# Patient Record
Sex: Male | Born: 1945 | Race: Black or African American | Hispanic: No | Marital: Single | Smoking: Current every day smoker
Health system: Southern US, Community
[De-identification: ages and names within clinical notes are randomized; demographics above are authoritative.]

## PROBLEM LIST (undated history)

## (undated) DIAGNOSIS — M545 Low back pain, unspecified: Secondary | ICD-10-CM

## (undated) DIAGNOSIS — K219 Gastro-esophageal reflux disease without esophagitis: Secondary | ICD-10-CM

## (undated) DIAGNOSIS — G8929 Other chronic pain: Secondary | ICD-10-CM

## (undated) DIAGNOSIS — L03116 Cellulitis of left lower limb: Secondary | ICD-10-CM

## (undated) DIAGNOSIS — M1990 Unspecified osteoarthritis, unspecified site: Secondary | ICD-10-CM

## (undated) HISTORY — PX: BACK SURGERY: SHX140

## (undated) HISTORY — PX: POSTERIOR FUSION LUMBAR SPINE: SUR632

---

## 1978-10-11 HISTORY — PX: LACERATION REPAIR: SHX5168

## 2004-04-27 ENCOUNTER — Emergency Department (HOSPITAL_COMMUNITY): Admission: EM | Admit: 2004-04-27 | Discharge: 2004-04-27 | Payer: Self-pay | Admitting: Emergency Medicine

## 2004-05-24 ENCOUNTER — Emergency Department (HOSPITAL_COMMUNITY): Admission: EM | Admit: 2004-05-24 | Discharge: 2004-05-25 | Payer: Self-pay | Admitting: *Deleted

## 2004-06-05 ENCOUNTER — Ambulatory Visit (HOSPITAL_COMMUNITY): Admission: RE | Admit: 2004-06-05 | Discharge: 2004-06-05 | Payer: Self-pay | Admitting: Family Medicine

## 2004-07-22 ENCOUNTER — Encounter: Admission: RE | Admit: 2004-07-22 | Discharge: 2004-07-22 | Payer: Self-pay | Admitting: Neurosurgery

## 2004-09-23 ENCOUNTER — Inpatient Hospital Stay (HOSPITAL_COMMUNITY): Admission: RE | Admit: 2004-09-23 | Discharge: 2004-09-27 | Payer: Self-pay | Admitting: Neurosurgery

## 2004-10-23 ENCOUNTER — Encounter: Admission: RE | Admit: 2004-10-23 | Discharge: 2004-10-23 | Payer: Self-pay | Admitting: Neurosurgery

## 2004-12-18 ENCOUNTER — Encounter: Admission: RE | Admit: 2004-12-18 | Discharge: 2004-12-18 | Payer: Self-pay | Admitting: Neurosurgery

## 2005-03-26 ENCOUNTER — Encounter: Admission: RE | Admit: 2005-03-26 | Discharge: 2005-03-26 | Payer: Self-pay | Admitting: Neurosurgery

## 2005-10-24 ENCOUNTER — Emergency Department (HOSPITAL_COMMUNITY): Admission: EM | Admit: 2005-10-24 | Discharge: 2005-10-24 | Payer: Self-pay | Admitting: Emergency Medicine

## 2005-12-08 ENCOUNTER — Emergency Department (HOSPITAL_COMMUNITY): Admission: EM | Admit: 2005-12-08 | Discharge: 2005-12-08 | Payer: Self-pay | Admitting: Emergency Medicine

## 2006-03-21 ENCOUNTER — Emergency Department (HOSPITAL_COMMUNITY): Admission: EM | Admit: 2006-03-21 | Discharge: 2006-03-21 | Payer: Self-pay | Admitting: Emergency Medicine

## 2006-08-31 ENCOUNTER — Emergency Department (HOSPITAL_COMMUNITY): Admission: EM | Admit: 2006-08-31 | Discharge: 2006-08-31 | Payer: Self-pay | Admitting: Emergency Medicine

## 2006-09-01 ENCOUNTER — Emergency Department (HOSPITAL_COMMUNITY): Admission: EM | Admit: 2006-09-01 | Discharge: 2006-09-01 | Payer: Self-pay | Admitting: Emergency Medicine

## 2006-09-07 ENCOUNTER — Emergency Department (HOSPITAL_COMMUNITY): Admission: EM | Admit: 2006-09-07 | Discharge: 2006-09-07 | Payer: Self-pay | Admitting: *Deleted

## 2006-09-14 ENCOUNTER — Emergency Department (HOSPITAL_COMMUNITY): Admission: EM | Admit: 2006-09-14 | Discharge: 2006-09-14 | Payer: Self-pay | Admitting: *Deleted

## 2007-01-18 ENCOUNTER — Encounter: Admission: RE | Admit: 2007-01-18 | Discharge: 2007-01-18 | Payer: Self-pay | Admitting: Neurosurgery

## 2007-07-10 ENCOUNTER — Emergency Department (HOSPITAL_COMMUNITY): Admission: EM | Admit: 2007-07-10 | Discharge: 2007-07-10 | Payer: Self-pay | Admitting: Emergency Medicine

## 2007-10-14 ENCOUNTER — Encounter: Admission: RE | Admit: 2007-10-14 | Discharge: 2007-10-14 | Payer: Self-pay | Admitting: Neurosurgery

## 2008-04-10 ENCOUNTER — Inpatient Hospital Stay (HOSPITAL_COMMUNITY): Admission: RE | Admit: 2008-04-10 | Discharge: 2008-04-13 | Payer: Self-pay | Admitting: Neurosurgery

## 2008-05-08 ENCOUNTER — Encounter: Admission: RE | Admit: 2008-05-08 | Discharge: 2008-05-08 | Payer: Self-pay | Admitting: Neurosurgery

## 2009-06-19 ENCOUNTER — Encounter: Admission: RE | Admit: 2009-06-19 | Discharge: 2009-06-19 | Payer: Self-pay | Admitting: Neurosurgery

## 2009-08-09 HISTORY — PX: SYNOVIAL CYST EXCISION: SUR507

## 2009-08-09 HISTORY — PX: LUMBAR LAMINECTOMY: SHX95

## 2009-08-16 ENCOUNTER — Ambulatory Visit (HOSPITAL_COMMUNITY): Admission: RE | Admit: 2009-08-16 | Discharge: 2009-08-17 | Payer: Self-pay | Admitting: Neurosurgery

## 2009-12-09 ENCOUNTER — Emergency Department (HOSPITAL_COMMUNITY): Admission: EM | Admit: 2009-12-09 | Discharge: 2009-12-09 | Payer: Self-pay | Admitting: Emergency Medicine

## 2010-03-02 ENCOUNTER — Encounter: Payer: Self-pay | Admitting: Neurosurgery

## 2010-04-27 LAB — SURGICAL PCR SCREEN
MRSA, PCR: NEGATIVE
Staphylococcus aureus: NEGATIVE

## 2010-04-27 LAB — CBC
MCV: 91 fL (ref 78.0–100.0)
Platelets: 217 10*3/uL (ref 150–400)
RBC: 4.8 MIL/uL (ref 4.22–5.81)
RDW: 13.1 % (ref 11.5–15.5)
WBC: 4.4 10*3/uL (ref 4.0–10.5)

## 2010-04-27 LAB — DIFFERENTIAL
Basophils Absolute: 0 10*3/uL (ref 0.0–0.1)
Lymphocytes Relative: 34 % (ref 12–46)
Lymphs Abs: 1.5 10*3/uL (ref 0.7–4.0)
Neutro Abs: 2.3 10*3/uL (ref 1.7–7.7)
Neutrophils Relative %: 51 % (ref 43–77)

## 2010-04-27 LAB — BASIC METABOLIC PANEL
BUN: 9 mg/dL (ref 6–23)
Calcium: 9.5 mg/dL (ref 8.4–10.5)
Chloride: 104 mEq/L (ref 96–112)
Creatinine, Ser: 0.92 mg/dL (ref 0.4–1.5)
GFR calc Af Amer: >60 mL/min (ref >60)
GFR calc non Af Amer: >60 mL/min (ref >60)

## 2010-04-27 LAB — TYPE AND SCREEN
ABO/RH(D): A POS
Antibody Screen: NEGATIVE

## 2010-05-27 LAB — BASIC METABOLIC PANEL
BUN: 7 mg/dL (ref 6–23)
Creatinine, Ser: 0.93 mg/dL (ref 0.4–1.5)
GFR calc non Af Amer: >60 mL/min (ref >60)
Glucose, Bld: 103 mg/dL — ABNORMAL HIGH (ref 70–99)

## 2010-05-27 LAB — CBC
HCT: 43.7 % (ref 39.0–52.0)
MCV: 90.2 fL (ref 78.0–100.0)
Platelets: 241 10*3/uL (ref 150–400)
RDW: 13.3 % (ref 11.5–15.5)
WBC: 3.7 10*3/uL — ABNORMAL LOW (ref 4.0–10.5)

## 2010-05-27 LAB — TYPE AND SCREEN: ABO/RH(D): A POS

## 2010-05-27 LAB — ABO/RH: ABO/RH(D): A POS

## 2010-06-24 NOTE — Op Note (Signed)
[name], [name]               ACCOUNT NO.:  [id number]   MEDICAL RECORD NO.:  [id number]          PATIENT TYPE:  INP   LOCATION:  3001                         FACILITY:  [facility]   PHYSICIAN:  Kathaleen Maser. Pool, M.D.    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  Status post L4-5 posterior lumbar interbody fusion  utilizing tangent interbody allograft wedge, Telamon interbody PEEK cage  and local autografting and posterolateral fusion with pedicle screw  instrumentation.   POSTOPERATIVE DIAGNOSES:  L3-4 degenerative disk disease with  spondylosis and stenosis.  Status post L4-5 posterior lumbar interbody  fusion utilizing tangent interbody allograft wedge, Telamon interbody  PEEK cage and local autografting and posterolateral fusion with pedicle  screw instrumentation.   PROCEDURE NAME:  Reexploration of L4-5 fusion with removal of hardware.  L3-4 redo decompressive laminectomy with bilateral L3 and L4  decompressive foraminotomies, more than it would be required for simple  interbody fusion alone.  L3-4 posterior lumbar interbody fusion  utilizing tangent interbody allograft wedge, Telamon interbody PEEK cage  and local autografting.  L3-4 posterolateral arthrodesis utilizing non-  segmental pedicle screw instrumentation and local autograft.   SURGEON:  Kathaleen Maser. Pool, MD   ASSISTANT:  Reinaldo Meeker, MD   ANESTHESIA:  General endotracheal.   INDICATIONS:  Mr. [name] is a 65 year old male status post previous L4-  5 decompression and fusion.  The patient presents now with worsening  back and lower extremity pain.  Workup demonstrates evidence of  significant stenosis at the L3-4 level above the level of previous  fusion.  Fusion at L4-5 appears to be solid.  The patient presents now  for L3-4  decompression and fusion and instrumentation.   OPERATIVE NOTE:  The patient was brought to the operative room and  placed on the operating table in the supine position.  After adequate  level of anesthesia was achieved, the patient was positioned prone onto  a Wilson frame, appropriately padded, the patient's lumbar region was  prepped and sterilely.  A #10 blade was used to make a curvilinear skin  incision overlying the L3, L4, and L5 levels.  This was carried sharply  in the midline.  Subperiosteal dissection was then performed exposing  the lamina and facet joints of L3 and L4 as well as the transverse  processes of L3 and L4.  Deep self-retaining retractor was placed.  Intraoperative fluoroscopy was used and levels were confirmed.  Previous  pedicle screw instrumentation at L4-5 was dissected free.  Pedicle screw  instrumentation was disassembled.  Fusion at L4-5 was then inspected and  found to be solid.  Pedicle screws at L5 were removed.  Pedicle screws  at L4 were left in place.  Previous laminectomy defect was cleaned of  epidural scar.  Complete laminectomy of L3 was then performed.  Complete  inferior facetectomies of L3 were performed bilaterally.  Complete  inferior facetectomies of L3 were performed bilaterally.  Superior  facetectomies of L4 were performed bilaterally.  Ligament flavum and  epidural scar were elevated and resected in piecemeal fashion using  Kerrison rongeurs.  The underlying thecal sac and exiting L3 and L4  nerve root were identified.  Complete decompressive foraminotomies were  then performed along its course of exiting nerve roots bilaterally.  Bilateral diskectomies were then performed at L3-4.  Disk space was then  sequentially dilated to 10 mm.  With a 10-mm distractor left on the  patient's right side.  Thecal sac and nerve root were inspected on the  left side.  Disk space was then reamed and cut with 10-mm tangent  instrument.  Soft tissue was  removed from the interspace.  A 10 x 26 mm  Telamon cage was packed with morselized autograft, then packed into  place, recessed approximately 2 mm from posterior cortical margin of L3.  Distractor was removed from the patient's right side.  Thecal sac and  nerve root were inspected on the right side.  Disk space was again  reamed and then cut with a 10-mm tangent instrument.  Soft tissues were  then removed from the interspace.  Disk space was further curettaged.  Morselized autograft was then packed into the interspace.  An 10 x 26 mm  tangent wedge was then packed into place and recessed approximately 2 mm  from posterior cortical margin of L3.  Pedicles of L3 were then  identified using surface landmarks and intraoperative fluoroscopy.  Superficial bone overlying the pedicle was then removed using a high-  speed drill.  Each pedicle was then probed using pedicle awl.  Pedicle  awl track was then tapped with 5.2 mm screw tapper.  Each screw tap hole  was then probed and found to be solid within bone.  A 6.75 x 45 mm  radius screws were placed bilaterally at L3.  Transverse processes of L3  and L4 were then decorticated using high speed drill.  Morselized  autograft was packed posterolaterally for later fusion.  A short segment  of titanium rod was then placed over the screw heads at L3 and L4.  Locking caps were placed over screw head.  Locking caps were then  engaged in the construct under compression.  Final images revealed good  position of bone grafts, hardware at operative level, normal alignment  of spine.  Wound was then irrigated with antibiotic solution.  Gelfoam  was placed topically and hemostasis was found to be good.  A medium  Hemovac drain was left in the interspace.  Wound was then closed in  layers with Vicryl suture.  Steri-Strips and sterile dressing were  applied.  There were no complications.  The patient tolerated the  procedure well and returned to the recovery  room postoperatively.           ______________________________  Kathaleen Maser Pool, M.D.     HAP/MEDQ  D:  04/10/2008  T:  04/11/2008  Job:  [id number]

## 2010-06-24 NOTE — Discharge Summary (Signed)
NAMETERION, HEDMAN               ACCOUNT NO.:  [id number]   MEDICAL RECORD NO.:  [id number]          PATIENT TYPE:  INP   LOCATION:  3001                         FACILITY:  [facility]   PHYSICIAN:  Kathaleen Maser. Pool, M.D.    DATE OF BIRTH:  Mar 04, 1945   DATE OF ADMISSION:  04/10/2008  DATE OF DISCHARGE:  04/12/2008                               DISCHARGE SUMMARY   FINAL DIAGNOSIS:  L3-4 degenerative disk disease with stenosis and  instability.   OPERATION AND TREATMENTS:  L3-4 decompressive laminectomy with L3-4  posterior interbody lumbar fusion with the tangent interbody allograft  wedge, Telamon interbody PEEK cage, and local autografting.   HISTORY OF PRESENT ILLNESS:  [name] is a 65 year old male who is  status post previous L4-5 decompression and fusion, now presents with L3-  4 stenosis and instability.  The patient presents now for decompression  and fusion.   HOSPITAL COURSE:  The patient was taken to the operating room and  underwent uncomplicated L3-4 decompression and fusion with  instrumentation was performed.  Postoperatively, the patient has done  quite well.  His preoperative back and right lower extremity pain has  resolved.  His strength and sensation are intact.  His wound is healing  well.  He is gradually mobilized with the aid of physical and  occupational therapy without difficulty.  He is up ambulatory without  assistance.  He is tolerating a regular diet.  Plan is for him to be  discharged to a short-term nursing facility for further rehabilitation  as the patient lives alone.   CONDITION ON DISCHARGE:  Improved.   DISCHARGE FOLLOWUP:  The patient is to follow up in my office in 2  weeks.   DISCHARGE WOUND CARE:  The patient is to keep his wound covered,  changing the bandage after showering.   DISCHARGE MEDICATIONS:  Percocet 1-2 p.o. p.r.n. q.4 h. as needed for  pain.  Valium 5 mg to 10 mg p.o. q.6 h. p.r.n. as needed for spasm and  pain.  The  patient is no other medications.           ______________________________  Kathaleen Maser Pool, M.D.     HAP/MEDQ  D:  04/12/2008  T:  04/13/2008  Job:  [id number]

## 2010-06-27 NOTE — Op Note (Signed)
[name], [name] NO.:  [id number]   MEDICAL RECORD NO.:  [id number]          PATIENT TYPE:  INP   LOCATION:  2899                         FACILITY:  [facility]   PHYSICIAN:  Kathaleen Maser. Pool, M.D.    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   OPERATION PERFORMED:  1.  L4-5 decompressive laminectomy with foraminotomy.  Decompression much      more extensive that required for simple interbody fusion alone.  2.  Posterior lumbar interbody fusion utilizing tangent interbody allograft      wedge, Telemon interbody cage and local autografting.  Posterolateral      arthrodesis utilizing nonsegmental pedicle screw instrumentation and      local autografting.   SURGEON:  Kathaleen Maser. Pool, M.D.   ASSISTANT:  Reinaldo Meeker, M.D.   ANESTHESIA:  General endotracheal.   INDICATIONS FOR PROCEDURE:  [name] is a 65 year old male with history  of back and bilateral lower extremity pain, paresthesias and weakness.  Work-  up demonstrates evidence of a grade 1 L4-5 degenerative spondylolisthesis  with severe stenosis.  The patient has been counseled as to his options. He  has decided to proceed with an L4-5 decompression and fusion with  instrumentation.   DESCRIPTION OF PROCEDURE:  The patient was taken to the operating room and  placed on the table in the supine position.  After adequate level of  anesthesia was achieved, the patient was positioned prone onto a Wilson  frame and appropriately padded.  The patient's lumbar region was prepped and  draped sterilely.  A 10 blade was used to make a linear skin incision  overlying the L3, 4, and 5 levels.  This was carried down sharply in the  midline.  A  subperiosteal dissection was then performed exposing the lamina  and facet joints of the L3, L4 and L5 levels as well as the L4 and L5  transverse processes.  Deep self-retaining retractor was placed.  Intraoperative fluoroscopy was used, the levels were confirmed.  Decompressive laminectomy was then performed using Leksell rongeurs,  Kerrison rongeurs and high speed drill to remove the entire lamina of L4 and  the superior aspect and lamina of L5.  Complete inferior facetectomy was  performed bilaterally at L4.  Superior facetectomy was then performed  bilaterally at L5.  All bone was cleaned and used in later autografting.  The  ligamentum flavum was then elevated and resected in piecemeal fashion using  Kerrison rongeurs.  The underlying thecal sac and exiting L4 and L5 nerve  roots were identified.  Wide foraminotomies were then performed along the  course of the exiting nerve roots bilaterally.  Epidural venous plexus was  coagulated and cut.  Starting first on patient's left side, the thecal sac  and nerve roots were mobilized and retracted towards the midline.  The disk  space was isolated and incised with a 15 blade in rectangular fashion.  A  wide disk space cleanout was then achieved using pituitary rongeurs and  forward angled Epstein curets and upward biting pituitary rongeurs.  The  procedure was then repeated on the contralateral side.  After a very  thorough diskectomy had been performed, the patient's disk space was then  distracted up to 12 mm.  With a 12 mm distractor left in the patient's right  side, the thecal sac and nerve roots were protected on the left side.  The  disk space was then reamed with 12 mm tangent box cutter and then cut with  12 mm tangent chisel.  Soft tissues were removed from the interspace.  A 12  x 26 mm Telemon cage packed with interbody bone and demineralized bone  matrix was then impacted into place and recessed approximately 2 mm from the   posterior cortical margin.  Distractors removed from patient's right side.  Thecal sac and nerve root protected on the right side.  Disk space once  again reamed and cut with 12 mm tangent instruments.  Soft tissues were  removed from the interspace.  The disk space was further curettaged.  Morcellized autograft was packed into the interspace.  A 12 x 26 mm tangent  wedge was then impacted into place, recessed approximately 2 mm from the  posterior cortical margin.  The pedicles at L4 and L5 were then identified  using surface landmarks and intraoperative fluoroscopy.  Superficial bone  overlying the pedicles was then removed utilizing high speed drill.  Each  pedicle was then probed using pedicle awl.  Each pedicle awl tract was then  tapped with 5.25 mm screw tap.  Each screw tap hole was probed and found to  be solidly within bone.  6.75 x 50 mm spiral 90D screws placed bilaterally  at L4, 6.75 x 45 mm screws placed bilaterally at L5.  Transverse process  were then decorticated using high speed drill at L4-5 bilaterally.  Morcellized autograft packed posterolaterally for later arthrodesis.  Short  segment titanium rod was then contoured and placed over the screw heads at  L4-5.  Locking caps were then placed over the screw heads.  Locking caps  then engaged with the construct under compression.  Final images revealed  good position of bone grafts and hardware at the proper operative levels  with normal alignment of the spine.  The wound was then irrigated with  antibiotic solution.  Gelfoam was placed topically for hemostasis which was  found to be good.  Microscope and retractor system were removed.  A medium  Hemovac drain was left in the epidural space.  The wound was then closed in  layers with Vicryl sutures.  Steri-Strips and sterile dressing were applied.  There were no apparent complications.  The patient tolerated the procedure well and returned to the recovery room  postoperatively.           ______________________________  Kathaleen Maser Pool, M.D.     HAP/MEDQ  D:  09/23/2004  T:  09/23/2004  Job:  [id number]

## 2010-06-27 NOTE — Discharge Summary (Signed)
NAMESIRRON, FRANCESCONI NO.:  [id number]   MEDICAL RECORD NO.:  [id number]           PATIENT TYPE:   LOCATION:                                 FACILITY:   PHYSICIAN:  Kathaleen Maser. Pool, M.D.         DATE OF BIRTH:   DATE OF ADMISSION:  09/23/2004  DATE OF DISCHARGE:  09/27/2004                                 DISCHARGE SUMMARY   SERVICE:  Neurosurgery.   FINAL DIAGNOSIS:  L4-5 degenerative grade I spondylolisthesis with stenosis.   HISTORY OF PRESENT ILLNESS:  Mr. Boomhower is a 65 year old male with a  history of symptomatic lumbar stenosis.  Workup demonstrates evidence of  marked stenosis secondary to disk degeneration and degenerative  spondylolisthesis with facet arthropathy.  The patient presents now for  decompression and fusion.   HOSPITAL COURSE:  The patient went to the operating room where an  uncomplicated L4-5 decompression and fusion was performed.  Postoperatively,  the patient's course was unremarkable.  He gradually mobilized.  The pain  was much improved.  He was able to be discharged on his third postoperative  day.   CONDITION ON DISCHARGE:  Improved.   DISCHARGE DISPOSITION:  The patient will be discharged to home and followup  in my office in one week.           ______________________________  Kathaleen Maser. Pool, M.D.     HAP/MEDQ  D:  08/06/2005  T:  08/06/2005  Job:  [id number]

## 2010-07-02 ENCOUNTER — Emergency Department (HOSPITAL_COMMUNITY): Payer: Medicare Other

## 2010-07-02 ENCOUNTER — Observation Stay (HOSPITAL_COMMUNITY)
Admission: EM | Admit: 2010-07-02 | Discharge: 2010-07-04 | Disposition: A | Discharge status: Home / Self Care | Payer: Medicare Other | Attending: Internal Medicine | Admitting: Internal Medicine

## 2010-07-02 DIAGNOSIS — Z8249 Family history of ischemic heart disease and other diseases of the circulatory system: Secondary | ICD-10-CM | POA: Insufficient documentation

## 2010-07-02 DIAGNOSIS — R0602 Shortness of breath: Secondary | ICD-10-CM | POA: Insufficient documentation

## 2010-07-02 DIAGNOSIS — R7309 Other abnormal glucose: Secondary | ICD-10-CM | POA: Insufficient documentation

## 2010-07-02 DIAGNOSIS — K047 Periapical abscess without sinus: Secondary | ICD-10-CM | POA: Insufficient documentation

## 2010-07-02 DIAGNOSIS — F17200 Nicotine dependence, unspecified, uncomplicated: Secondary | ICD-10-CM | POA: Insufficient documentation

## 2010-07-02 DIAGNOSIS — R0789 Other chest pain: Principal | ICD-10-CM | POA: Insufficient documentation

## 2010-07-02 DIAGNOSIS — Z79899 Other long term (current) drug therapy: Secondary | ICD-10-CM | POA: Insufficient documentation

## 2010-07-02 LAB — BASIC METABOLIC PANEL
BUN: 13 mg/dL (ref 6–23)
CO2: 29 mEq/L (ref 19–32)
Chloride: 99 mEq/L (ref 96–112)
Glucose, Bld: 101 mg/dL — ABNORMAL HIGH (ref 70–99)
Potassium: 4.3 mEq/L (ref 3.5–5.1)
Sodium: 139 mEq/L (ref 135–145)

## 2010-07-02 LAB — POCT CARDIAC MARKERS
CKMB, poc: 3.5 ng/mL (ref 1.0–8.0)
Troponin i, poc: <0.05 ng/mL (ref 0.00–0.09)

## 2010-07-02 LAB — CBC
HCT: 43.4 % (ref 39.0–52.0)
Hemoglobin: 14.6 g/dL (ref 13.0–17.0)
MCV: 90.4 fL (ref 78.0–100.0)
WBC: 6.6 10*3/uL (ref 4.0–10.5)

## 2010-07-02 LAB — CARDIAC PANEL(CRET KIN+CKTOT+MB+TROPI)
CK, MB: 3.6 ng/mL (ref 0.3–4.0)
CK, MB: 3.4 ng/mL (ref 0.3–4.0)
Relative Index: 1.3 (ref 0.0–2.5)
Total CK: 271 U/L — ABNORMAL HIGH (ref 7–232)
Total CK: 230 U/L (ref 7–232)
Troponin I: <0.3 ng/mL (ref <0.30)

## 2010-07-02 LAB — DIFFERENTIAL
Basophils Absolute: 0 10*3/uL (ref 0.0–0.1)
Eosinophils Relative: 2 % (ref 0–5)
Lymphocytes Relative: 21 % (ref 12–46)
Lymphs Abs: 1.4 10*3/uL (ref 0.7–4.0)
Neutro Abs: 4.2 10*3/uL (ref 1.7–7.7)

## 2010-07-03 DIAGNOSIS — I517 Cardiomegaly: Secondary | ICD-10-CM

## 2010-07-03 DIAGNOSIS — R079 Chest pain, unspecified: Secondary | ICD-10-CM

## 2010-07-03 DIAGNOSIS — R0602 Shortness of breath: Secondary | ICD-10-CM

## 2010-07-03 LAB — CBC
MCV: 89.3 fL (ref 78.0–100.0)
Platelets: 207 10*3/uL (ref 150–400)
RDW: 12.5 % (ref 11.5–15.5)
WBC: 4.8 10*3/uL (ref 4.0–10.5)

## 2010-07-03 LAB — CARDIAC PANEL(CRET KIN+CKTOT+MB+TROPI)
Relative Index: 1.5 (ref 0.0–2.5)
Troponin I: <0.3 ng/mL (ref <0.30)

## 2010-07-03 LAB — LIPID PANEL
HDL: 50 mg/dL (ref >39)
Total CHOL/HDL Ratio: 3.2 RATIO
Triglycerides: 92 mg/dL (ref <150)
VLDL: 18 mg/dL (ref 0–40)

## 2010-07-03 LAB — BASIC METABOLIC PANEL
BUN: 15 mg/dL (ref 6–23)
GFR calc Af Amer: >60 mL/min (ref >60)
GFR calc non Af Amer: >60 mL/min (ref >60)
Potassium: 3.9 mEq/L (ref 3.5–5.1)

## 2010-07-03 LAB — HEMOGLOBIN A1C: Mean Plasma Glucose: 140 mg/dL — ABNORMAL HIGH (ref <117)

## 2010-07-03 NOTE — Consult Note (Signed)
NAMEHARJAS, BIGGINS NO.:  [id number]  MEDICAL RECORD NO.:  [id number]           PATIENT TYPE:  O  LOCATION:  A311                          FACILITY:  [facility]  PHYSICIAN:  Jonelle Sidle, MD DATE OF BIRTH:  11/06/1945  DATE OF CONSULTATION: DATE OF DISCHARGE:                                CONSULTATION   REQUESTING SERVICE:  [facility] team.  PRIMARY CARE PHYSICIAN:  None.  REASON FOR CONSULTATION:  Chest pain and shortness of breath.  HISTORY OF PRESENT ILLNESS:  Mr. Barrack is a 65 year old male with no regular medical followup, on over-the-counter Aleve for chronic back pain but no other regular medications, and with history of lumbar surgery by Dr. Dutch Quint over the years, most recently in 2011.  He presented to the hospital complaining of chest tightness, some radiation to the left arm, on baseline history of progressive shortness of breath with exertion over the last several months.  He has also been experiencing some chest tightness with exertion, no unusual diaphoresis, nausea, or emesis.  He was also bothered by some mouth/tooth pain, with very poor dentition, and came in to be evaluated further.  He was admitted for observation and has ruled out for myocardial infarction with peak troponin-I level less than 0.30, normal CK-MB of relative indices.  His electrocardiogram shows sinus rhythm at 82 beats per minute with no acute ST-segment changes.  Echocardiography performed earlier today shows mild ventricular hypertrophy with a left ventricular ejection fraction of 60-65%, no regional wall motion abnormalities, mild aortic root dilatation, and trivial mitral regurgitation.  Chest x-ray demonstrates no acute findings.  We have been consulted to assist with further evaluation.  ALLERGIES:  No known drug allergies.  PAST MEDICAL HISTORY:  As noted above.  The patient denies any longstanding history of hypertension, type 2 diabetes  mellitus, hyperlipidemia, or cardiovascular disease.  He underwent L4-L5 decompression back in 2006, L3-L4 decompression in 2010, and L2-L3 laminectomy with resection of a synovial cyst in 2011.  MEDICATIONS: 1. Aspirin 325 mg p.o. daily. 2. Cleocin 300 mg p.o. q.i.d. 3. Lovenox 40 mg subcu q.24 h. 4. Lopressor 12.5 mg p.o. b.i.d. 5. Protonix 40 mg p.o. daily. 6. Zocor 10 mg p.o. daily.  FAMILY HISTORY:  Significant for cardiovascular disease, premature in his father and brother.  Otherwise noncontributory.  SOCIAL HISTORY:  The patient has a longstanding tobacco use history, approximately 50-pack-years, denies any illicit substance use or regular alcohol use.  REVIEW OF SYSTEMS:  As detailed above.  Denies any cough, fevers, chills, hemoptysis.  No reported melena or hematochezia.  Has had a lot of mouth pain and tooth pain with very poor dentition.  No palpitations or syncope.  Otherwise reviewed negative.  PHYSICAL EXAMINATION:  VITAL SIGNS:  Temperature is 98.4 degrees, heart rate in the 50s in sinus rhythm, respirations 18, blood pressure is 103/69, oxygen saturation is 97% on room air. GENERAL:  This is an overweight male, in no acute distress without active chest pain. HEENT:  Conjunctiva and lids normal.  Pharynx is clear with poor dentition. NECK:  Supple.  No elevated JVP or carotid  bruits.  No thyromegaly. LUNGS:  Clear with diminished breath sounds. CARDIAC:  Regular rate and rhythm.  No S3.  No pericardial rub.  No significant systolic murmur. ABDOMEN:  Soft and nontender.  Bowel sounds present. EXTREMITIES:  Exhibit no significant pitting edema.  Distal pulses are 1- 2+. SKIN:  Warm and dry. MUSCULOSKELETAL:  No kyphosis is noted. NEUROPSYCHIATRIC:  The patient is alert and oriented x3.  Affect is grossly appropriate.  LABORATORY DATA:  WBC 4.8, hemoglobin 12.8, hematocrit 38.5, platelets 207.  Sodium 134, potassium 3.9, chloride 98, bicarb 30, glucose  100, BUN 15, creatinine 0.8.  Hemoglobin A1c 6.5.  Peak CK 271 with normal CK- MB relative indices.  Normal troponin-I levels.  Total cholesterol 159, triglycerides 92, HDL 50, LDL 91.  D-dimer 0.50.  IMPRESSION: 1. Presentation with progressive dyspnea on exertion as well as chest     tightness, worse recently, associated with a nonspecific     electrocardiogram and normal cardiac markers.  Cardiac risk factors     include age, gender, family history, possible undiagnosed diabetes     mellitus based on elevated hemoglobin A1c.  Also longstanding     tobacco use history.  He is clinically stable at this time. Concern would be for     progressive angina and underlying CAD. 2. Lumbar disk disease as outlined above status post 3 prior surgical     interventions, most recently in 2011, by Dr. Dutch Quint. 3. Dental caries.  RECOMMENDATIONS:  We will schedule an exercise Myoview for tomorrow for ischemic evaluation.  Would hold Lopressor at that time.  Will follow up 12-lead electrocardiogram in the morning as well.  Our service will follow with you with further recommendations.     Jonelle Sidle, MD     SGM/MEDQ  D:  07/03/2010  T:  07/03/2010  Job:  [id number]  Electronically Signed by Nona Dell MD on 07/03/2010 05:57:51 PM

## 2010-07-04 ENCOUNTER — Encounter (HOSPITAL_COMMUNITY): Payer: Self-pay | Admitting: Radiology

## 2010-07-04 ENCOUNTER — Observation Stay (HOSPITAL_COMMUNITY): Payer: Medicare Other

## 2010-07-04 LAB — BASIC METABOLIC PANEL
Chloride: 103 mEq/L (ref 96–112)
Creatinine, Ser: 0.92 mg/dL (ref 0.4–1.5)
GFR calc Af Amer: >60 mL/min (ref >60)
Potassium: 4 mEq/L (ref 3.5–5.1)

## 2010-07-04 MED ORDER — TECHNETIUM TC 99M TETROFOSMIN IV KIT
10.0000 | PACK | Freq: Once | INTRAVENOUS | Status: AC | PRN
  Administered 2010-07-04: 10.33 via INTRAVENOUS

## 2010-07-04 MED ORDER — TECHNETIUM TC 99M TETROFOSMIN IV KIT
30.0000 | PACK | Freq: Once | INTRAVENOUS | Status: AC | PRN
  Administered 2010-07-04: 29.3 via INTRAVENOUS

## 2010-07-05 ENCOUNTER — Observation Stay (HOSPITAL_COMMUNITY): Payer: Medicare Other

## 2010-07-07 NOTE — H&P (Signed)
NAMEMARGIE, [name]               ACCOUNT NO.:  [id number]  MEDICAL RECORD NO.:  [id number]           PATIENT TYPE:  E  LOCATION:  [facility]                          FACILITY:  [facility]  PHYSICIAN:  Jeoffrey Massed, MD    DATE OF BIRTH:  [birth year]  DATE OF ADMISSION:  07/02/2010 DATE OF DISCHARGE:  LH                             HISTORY & PHYSICAL   PRIMARY CARE PRACTITIONER:  None.  CHIEF COMPLAINT:  Chest pain and shortness of breath.  HISTORY OF PRESENT ILLNESS:  The patient is a very pleasant 65 year old black male with no significant medical issues in the past comes in with the above-noted complaint.  Per the patient on and off for the past 1 month, he has been having chest tightness and mild shortness of breath associated with it.  He claims that for the past week or so, this has been worsening.  He claims that the chest tightness mostly is precipitated by him walking although he cannot exactly quantify the exact distance he walks before he gets the chest tightness.  There is no radiation of the pain.  There is no nausea or vomiting.  There is no diaphoresis.  At time of this interview, the patient is chest pain free and is very comfortable.  He presented to the ED for evaluation as he felt that the chest pain was coming on more frequently.  Please also note that this patient denies exertional dyspnea and he claims that the shortness of breath is associated only with chest pain.  The patient denies any headache, blurry vision.  Denies any abdominal pain, nausea, vomiting, or diarrhea.  ALLERGIES:  None.  PAST MEDICAL HISTORY:  None.  PAST SURGICAL HISTORY:  The patient has had decompressive laminectomies and foraminotomies of his lumbar spine.  MEDICATIONS AT HOME:  None except for as needed over-the-counter nonsteroidal anti-inflammatories.  FAMILY HISTORY:  The patient's brother had coronary artery disease in his 58s.  SOCIAL HISTORY:  The patient continues to smoke a  round of one pack a cigarettes a day and he has been doing this around 50 years.  He denies any other toxic habits.  REVIEW OF SYSTEMS:  A detailed review of 12 systems was done and these are negative except for the ones mentioned in the HPI.  PHYSICAL EXAM:  VITAL SIGNS:  Temperature of 98.5, heart rate of 82, blood pressure of 120/73, respiration of 20 and pulse ox of 96% on room air. GENERAL:  The patient is lying in bed, does not appear to be in any distress.  He is easily able to talk in full sentences and is not using any of his accessory muscles of respiration. NECK:  Supple. HEENT:  Atraumatic, normocephalic.  Pupils equally react to light and accommodation.  Oral cavity, the patient has numerous dental caries. However on his left lower incisor region, there appears to be a small indurated and tender area, possibly an abscess.  This is not draining. CHEST:  Bilaterally clear to auscultation. CARDIOVASCULAR:  Heart sounds are regular.  No murmurs heard. ABDOMEN:  Soft, nontender, nondistended. EXTREMITIES:  No edema. NEUROLOGIC:  The patient  is awake and alert and has no focal neurological deficits.  LABORATORY DATA: 1. CBC shows a WBC of 6.6, hemoglobin of 14.6, hematocrit of 43.4 and     a platelet count of 216. 2. Chemistry shows sodium of 139, potassium of 4.3, chloride of 99,     bicarb of 29, glucose of 101, BUN of 13, creatinine of 0.95 and a     calcium of 10.3. 3. Cardiac markers are negative.  RADIOLOGICAL STUDIES: 1. X-ray of the chest showed no acute findings. 2. EKG shows normal sinus rhythm with possible LVH pattern.  ASSESSMENT: 1. Rule out unstable angina. 2. Tobacco abuse. 3. Possible left lower tooth abscess in the incisor region.  PLAN: 1. The patient was brought in as an observation. 2. Cardiac enzymes will be cycled. 3. 2-D echocardiogram will be obtained. 4. Given his history, cardiology consultation will be placed for     possible stress  test or if his cardiac enzymes do turn out to be     positive, then he probably will need further intervention. 5. In the meantime, the patient will be placed on aspirin and beta-     blockers, statin and nitroglycerin on a p.r.n. basis. 6. He will be empirically started on clindamycin for his tooth     abscess. 7. Further plan will depend as the patient's clinical course evolves. 8. Tobacco cessation counseling has been done by me on admission. 9. DVT prophylaxis with Lovenox. 10.Code status, full code.  Total time spent 45 minutes.    Jeoffrey Massed, MD    SG/MEDQ  D:  07/02/2010  T:  07/02/2010  Job:  [id number]  Electronically Signed by Jeoffrey Massed  on 07/07/2010 06:01:06 PM

## 2010-07-07 NOTE — Discharge Summary (Signed)
[name], [name]               ACCOUNT NO.:  [id number]  MEDICAL RECORD NO.:  [id number]           PATIENT TYPE:  O  LOCATION:  A311                          FACILITY:  [facility]  PHYSICIAN:  Jeoffrey Massed, MD    DATE OF BIRTH:  November 13, 1945  DATE OF ADMISSION:  07/02/2010 DATE OF DISCHARGE:  05/25/2012LH                         DISCHARGE SUMMARY-REFERRING   PRIMARY CARE PRACTITIONER:  Delbert Harness, MD  PRIMARY DISCHARGE DIAGNOSES: 1. Atypical chest pain with nuclear stress test being negative. 2. Probable borderline diabetes. 3. Possible tooth abscess.  DISCHARGE MEDICATIONS: 1. Aspirin 81 mg 1 tablet a day. 2. Clindamycin 300 mg 1 tablet four times a day for another 6 days.  CONSULTATIONS:  Jonelle Sidle, MD of [facility].  BRIEF HISTORY OF PRESENT ILLNESS:  The patient is a very pleasant 65- year-old black male with a history of smoking came into the ED complaining of chest pain and shortness of breath.  He was then admitted to the hospitalist service for further evaluation and treatment.  For further details, please see the history and physical that was dictated by me on admission.  A 2-D echocardiogram showed an EF around 60-65% with normal systolic function.  PERTINENT LABORATORY DATA: 1. Cardiac enzymes were cycled and these were negative. 2. HbA1c is 6.5. 3. D-dimer was 0.50. 4. LDL cholesterol was 91.  PERTINENT RADIOLOGICAL STUDIES: 1. A chest x-ray showed no acute findings. 2. A nuclear stress test shows no large reversible perfusion defects     are noted to indicate ischemia.  BRIEF HOSPITAL COURSE: 1. Chest pain and chest tightness.  The patient was admitted and upon     further evaluation, he did have some risk factors namely smoking,     gender, advanced age, and possible new onset diabetes.  He was     placed on a telemetry floor, given aspirin, statin, and beta-     blockers.  Cardiac enzymes were cycled and these were negative.  A  2-D echocardiogram was done and results of which are noted above.     Cardiology was consulted and subsequently the patient underwent a     nuclear stress test which is essentially negative.  The patient is     then today being discharged home.  He has been instructed to call     Dr. Delbert Harness and his number has been provided in the chart for     followup. 2. Possible left incisor region tooth abscess.  The patient was given     clindamycin with very good results with complete resolution of     whatever swelling he had initially on admission.  The case     management worker has given the patient a number for a dentist in     [place], who does a lot of charity work and the patient claims he     will call and make an appointment. 3. Possible undiagnosed borderline diabetes.  The patient's HbA1c     during this admission was 6.5.  The patient was offered to be     started on oral hypoglycemic agents; however, at this  time, he     declined and he would rather try diet and lifestyle modification     changes.  When he follows up with his primary care practitioner, he     will need to repeat HbA1c in the next few weeks to months if it is     still high and possibly might need to consider starting oral     hypoglycemic agents.  DISPOSITION:  At this point, it is felt that this patient has met maximal benefit of inpatient hospitalization and is stable to be discharged home.  FOLLOWUP INSTRUCTIONS: 1. The patient has been instructed to call and make an appointment     with Dr. Delbert Harness in the next 1-2 weeks. 2. The patient has also been instructed to call the dental clinic as     instructed by our care management worker.  The number has been     placed on the pink sheet for the patient.  Total time spent coordinating discharge 45 minutes.     Jeoffrey Massed, MD     SG/MEDQ  D:  07/04/2010  T:  07/04/2010  Job:  [id number]  cc:   Delbert Harness, MD  Electronically Signed by Jeoffrey Massed  on 07/07/2010 06:01:30 PM

## 2010-11-05 LAB — CBC
Hemoglobin: 14.8
MCHC: 34.7
RBC: 4.76
WBC: 3.8 — ABNORMAL LOW

## 2010-11-05 LAB — BASIC METABOLIC PANEL
CO2: 26
Calcium: 9.3
GFR calc Af Amer: >60
Sodium: 137

## 2010-11-05 LAB — POCT CARDIAC MARKERS
CKMB, poc: 2.8
Myoglobin, poc: 78.6
Operator id: 218581
Troponin i, poc: <0.05

## 2010-11-05 LAB — DIFFERENTIAL
Basophils Relative: 1
Lymphocytes Relative: 40
Lymphs Abs: 1.5
Monocytes Absolute: 0.3
Monocytes Relative: 9
Neutro Abs: 1.8

## 2010-11-24 LAB — URINALYSIS, ROUTINE W REFLEX MICROSCOPIC
Bilirubin Urine: NEGATIVE
Ketones, ur: NEGATIVE
Nitrite: NEGATIVE
Protein, ur: NEGATIVE

## 2012-04-28 ENCOUNTER — Telehealth: Payer: Self-pay | Admitting: *Deleted

## 2012-04-28 ENCOUNTER — Other Ambulatory Visit: Payer: Self-pay | Admitting: *Deleted

## 2012-04-28 ENCOUNTER — Ambulatory Visit (INDEPENDENT_AMBULATORY_CARE_PROVIDER_SITE_OTHER): Payer: Medicare Other

## 2012-04-28 ENCOUNTER — Ambulatory Visit (INDEPENDENT_AMBULATORY_CARE_PROVIDER_SITE_OTHER): Payer: Medicare Other | Admitting: Orthopedic Surgery

## 2012-04-28 ENCOUNTER — Encounter: Payer: Self-pay | Admitting: Orthopedic Surgery

## 2012-04-28 VITALS — BP 122/86 | Ht 74.0 in | Wt 250.0 lb

## 2012-04-28 DIAGNOSIS — M1710 Unilateral primary osteoarthritis, unspecified knee: Secondary | ICD-10-CM | POA: Insufficient documentation

## 2012-04-28 DIAGNOSIS — M25562 Pain in left knee: Secondary | ICD-10-CM

## 2012-04-28 DIAGNOSIS — M25569 Pain in unspecified knee: Secondary | ICD-10-CM | POA: Insufficient documentation

## 2012-04-28 NOTE — Progress Notes (Signed)
Patient ID: [name], male   DOB: Oct 17, 1945, 67 y.o.   MRN: [id number] Chief Complaint  Patient presents with  . Knee Pain    Bilateral knee pain    HISTORY: 67 YO MALE S/P 3 LUMBAR SURGERIES BY DR POOLE PRESENTS WITH 2 YR H/O Pain in both knees with sharp, dull, throbbing, stabbing, burning 9/10. Symptoms constant pain with walking. He has some numbness and tingling is related to his lumbar surgeries as locking, catching, and swelling of both knees. He only takes Aleve for pain. 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 He does not have her cardiologist.   Review of Systems  Respiratory: Positive for shortness of breath.   SKIN: ITCHING   No past medical history on file.  Past Surgical History  Procedure Laterality Date  . Back surgery      3 PROCEDURES    History  Substance Use Topics  . Smoking status: Current Every Day Smoker  . Smokeless tobacco: Not on file  . Alcohol Use: Not on file     Vital signs are stable as recorded BP 122/86  Ht 6\' 2"  (1.88 m)  Wt 250 lb (113.399 kg)  BMI 32.08 kg/m2   General appearance is normal  The patient is alert and oriented x3  The patient's mood and affect are normal  Gait assessment:  FLEXED POSTURE LIMPING   The cardiovascular exam reveals 1+ pulses and temperature without edema or swelling.  The lymphatic system is negative for palpable lymph nodes  The sensory exam is normal.  There are no pathologic reflexes. Balance is FAIRLY NORMAL   Upper extremity exam  The right and left upper extremity:   Inspection and palpation revealed no abnormalities in the upper extremities.   Range of motion is full without contracture.  Motor exam is normal with grade 5 strength.  The joints are fully reduced without subluxation.  There is no atrophy or tremor and muscle tone is normal.  All joints are stable.     Exam of the KNEES   Inspection VARUS WITH MEDIAL JOINT LINE TENDERNESS Range of motion RIGHT 10-100, LEFT 10 - 105 Stability NORMAL  Strength NORMAL  Skin NORMAL   Knee pain, right - Plan: DG Knee AP/LAT W/[place] Right  Knee pain, left - Plan: DG Knee AP/LAT W/[place] Left  OA (osteoarthritis) of knee    My plan is to send him to orthopedics for consultation for bilateral knee replacement and cardiac support, [place]   I also injected both knees   Knee  Injection Procedure Note  Pre-operative Diagnosis: left knee oa  Post-operative Diagnosis: same  Indications: pain  Anesthesia: ethyl chloride   Procedure Details   Verbal consent was obtained for the procedure. Time out was completed.The joint was prepped with alcohol, followed by  Ethyl chloride spray and A 20 gauge needle was inserted into the knee via lateral approach; 4ml 1% lidocaine and 1 ml of depomedrol  was then injected into the joint . The needle was removed and the area cleansed and dressed.  Complications:  None; patient tolerated the procedure well. Knee  Injection Procedure Note  Pre-operative Diagnosis: right knee oa  Post-operative Diagnosis: same  Indications: pain  Anesthesia: ethyl chloride   Procedure Details   Verbal consent was obtained for the procedure. Time out was completed.The joint was prepped with alcohol, followed by  Ethyl chloride spray  and A 20 gauge needle was inserted into the knee via lateral approach; 4ml 1% lidocaine and 1 ml of depomedrol  was then injected into the joint . The needle was removed and the area cleansed and dressed.  Complications:  None; patient tolerated the procedure well.

## 2012-04-28 NOTE — Patient Instructions (Addendum)

## 2012-04-28 NOTE — Telephone Encounter (Signed)
Faxed referral and office notes to Dr. [facility] for bilateral knee replacements. Awaiting appointment.

## 2012-05-30 NOTE — Telephone Encounter (Signed)
Appointment June 08, 2012 with Dr. Charlann Boxer

## 2012-06-20 ENCOUNTER — Telehealth: Payer: Self-pay | Admitting: *Deleted

## 2012-06-20 NOTE — Telephone Encounter (Signed)
Patient was a no show for his 06/08/12 appointment with [facility].

## 2013-08-16 ENCOUNTER — Emergency Department (HOSPITAL_COMMUNITY)
Admission: EM | Admit: 2013-08-16 | Discharge: 2013-08-16 | Disposition: A | Discharge status: Home / Self Care | Payer: Medicare Other | Attending: Emergency Medicine | Admitting: Emergency Medicine

## 2013-08-16 ENCOUNTER — Encounter (HOSPITAL_COMMUNITY): Payer: Self-pay | Admitting: Emergency Medicine

## 2013-08-16 DIAGNOSIS — R21 Rash and other nonspecific skin eruption: Secondary | ICD-10-CM | POA: Insufficient documentation

## 2013-08-16 DIAGNOSIS — F17200 Nicotine dependence, unspecified, uncomplicated: Secondary | ICD-10-CM | POA: Insufficient documentation

## 2013-08-16 DIAGNOSIS — T7840XA Allergy, unspecified, initial encounter: Secondary | ICD-10-CM

## 2013-08-16 MED ORDER — FAMOTIDINE 20 MG PO TABS
20.0000 mg | ORAL_TABLET | Freq: Once | ORAL | Status: AC
  Administered 2013-08-16: 20 mg via ORAL
  Filled 2013-08-16: qty 1

## 2013-08-16 MED ORDER — DIPHENHYDRAMINE HCL 25 MG PO TABS: 25.0000 mg | ORAL_TABLET | Freq: Four times a day (QID) | ORAL | Status: DC | PRN

## 2013-08-16 MED ORDER — METHYLPREDNISOLONE SODIUM SUCC 125 MG IJ SOLR
125.0000 mg | Freq: Once | INTRAMUSCULAR | Status: AC
  Administered 2013-08-16: 125 mg via INTRAMUSCULAR
  Filled 2013-08-16: qty 2

## 2013-08-16 MED ORDER — PREDNISONE 50 MG PO TABS
50.0000 mg | ORAL_TABLET | Freq: Every day | ORAL | Status: DC
Start: 2013-08-16 — End: 2013-09-28

## 2013-08-16 MED ORDER — DIPHENHYDRAMINE HCL 25 MG PO CAPS
50.0000 mg | ORAL_CAPSULE | Freq: Once | ORAL | Status: AC
  Administered 2013-08-16: 50 mg via ORAL
  Filled 2013-08-16: qty 2

## 2013-08-16 NOTE — Discharge Instructions (Signed)

## 2013-08-16 NOTE — ED Notes (Signed)
Patient verbalizes understanding of discharge instructions, prescription medications, and follow up care. Patient ambulatory out of department at this time. 

## 2013-08-16 NOTE — ED Provider Notes (Signed)
CSN: [id number]     Arrival date & time 08/16/13  0126 History   First MD Initiated Contact with Patient 08/16/13 0134     Chief Complaint  Patient presents with  . Rash  . Allergic Reaction     (Consider location/radiation/quality/duration/timing/severity/associated sxs/prior Treatment) HPI Patient presents with several hours of a raised pruritic rash to his upper extremities and trunk. He states this has happened multiple times in the past. He denies any new food exposure, to new medicine and or detergents. He states he has had a similar reaction to tomatoes in the past. Patient states he did eat tomatoes this evening. He admits to a strange feeling in his throat is improved. He denies any shortness of breath. He's had no nausea or vomiting. History reviewed. No pertinent past medical history. Past Surgical History  Procedure Laterality Date  . Back surgery      3 PROCEDURES    No family history on file. History  Substance Use Topics  . Smoking status: Current Every Day Smoker  . Smokeless tobacco: Not on file  . Alcohol Use: Not on file    Review of Systems  Constitutional: Negative for fever and chills.  HENT: Negative for facial swelling, trouble swallowing and voice change.   Respiratory: Negative for chest tightness, shortness of breath, wheezing and stridor.   Gastrointestinal: Negative for nausea, vomiting and abdominal pain.  Musculoskeletal: Negative for back pain, neck pain and neck stiffness.  Skin: Positive for rash. Negative for wound.  Neurological: Negative for dizziness, weakness, light-headedness, numbness and headaches.  All other systems reviewed and are negative.     Allergies  Review of patient's allergies indicates no known allergies.  Home Medications   Prior to Admission medications   Medication Sig Start Date End Date Taking? Authorizing Provider  naproxen sodium (ANAPROX) 220 MG tablet Take 220 mg by mouth 2 (two) times daily with a meal.     Historical Provider, MD   BP 152/73  Pulse 97  Temp(Src) 98 F (36.7 C) (Oral)  Resp 20  Ht 6\' 2"  (1.88 m)  Wt 220 lb (99.791 kg)  BMI 28.23 kg/m2  SpO2 100% Physical Exam  Nursing note and vitals reviewed. Constitutional: He is oriented to person, place, and time. He appears well-developed and well-nourished. No distress.  HENT:  Head: Normocephalic and atraumatic.  Mouth/Throat: Oropharynx is clear and moist.  No lip or intraoral swelling  Eyes: EOM are normal. Pupils are equal, round, and reactive to light.  Neck: Normal range of motion. Neck supple.  Cardiovascular: Normal rate and regular rhythm.   Pulmonary/Chest: Effort normal and breath sounds normal. No stridor. No respiratory distress. He has no wheezes. He has no rales. He exhibits no tenderness.  Abdominal: Soft. Bowel sounds are normal. He exhibits no distension and no mass. There is no tenderness. There is no rebound and no guarding.  Musculoskeletal: Normal range of motion. He exhibits no edema and no tenderness.  Neurological: He is alert and oriented to person, place, and time.  Moves all extremities without deficit. Sensation is intact.  Skin: Skin is warm and dry. Rash (Plaque like,erythematous rash to upper extremities and trunk.) noted. No erythema.  Psychiatric: He has a normal mood and affect. His behavior is normal.    ED Course  Procedures (including critical care time) Labs Review Labs Reviewed - No data to display  Imaging Review No results found.   EKG Interpretation None      MDM  Final diagnoses:  None    We'll treat symptomatically and observe in the emergency department. The patient is protecting his airway and I see no evidence of any airway compromise.  Patient's rash has improved. No respiratory symptoms. He is resting comfortably in the emergency department. Discharge home with thorough return precautions  Loren Raceravid Ruthann Angulo, MD 08/17/13 0127

## 2013-08-16 NOTE — ED Notes (Signed)
Pt resting calmly w/ eyes closed. Rise & fall of the chest noted. Bed in low position, side rails up x2. NAD noted at this time.  

## 2013-08-16 NOTE — ED Notes (Signed)
Patient resting in bed, eyes closed. Easily arouses to voice. Patient states his rash and itching is not improving after medications given. Will continue to monitor

## 2013-08-16 NOTE — ED Notes (Signed)
Pt states he has a rash that started a couple hours ago. Pt denies any new foods, meds, clothing or detergents. Pt says throat feels funny, no signs of distress, pt able to speak in full sentences.

## 2013-09-28 ENCOUNTER — Encounter (HOSPITAL_COMMUNITY): Payer: Self-pay | Admitting: Emergency Medicine

## 2013-09-28 ENCOUNTER — Emergency Department (HOSPITAL_COMMUNITY)
Admission: EM | Admit: 2013-09-28 | Discharge: 2013-09-28 | Disposition: A | Discharge status: Home / Self Care | Payer: Medicare Other | Attending: Emergency Medicine | Admitting: Emergency Medicine

## 2013-09-28 DIAGNOSIS — L509 Urticaria, unspecified: Secondary | ICD-10-CM | POA: Insufficient documentation

## 2013-09-28 DIAGNOSIS — Z791 Long term (current) use of non-steroidal anti-inflammatories (NSAID): Secondary | ICD-10-CM | POA: Insufficient documentation

## 2013-09-28 DIAGNOSIS — Z7982 Long term (current) use of aspirin: Secondary | ICD-10-CM | POA: Diagnosis not present

## 2013-09-28 DIAGNOSIS — R21 Rash and other nonspecific skin eruption: Secondary | ICD-10-CM | POA: Insufficient documentation

## 2013-09-28 DIAGNOSIS — F17200 Nicotine dependence, unspecified, uncomplicated: Secondary | ICD-10-CM | POA: Insufficient documentation

## 2013-09-28 LAB — CBC WITH DIFFERENTIAL/PLATELET
Basophils Absolute: 0 10*3/uL (ref 0.0–0.1)
Basophils Relative: 0 % (ref 0–1)
EOS PCT: 7 % — AB (ref 0–5)
Eosinophils Absolute: 0.3 10*3/uL (ref 0.0–0.7)
HCT: 46.3 % (ref 39.0–52.0)
HEMOGLOBIN: 15.8 g/dL (ref 13.0–17.0)
LYMPHS ABS: 2 10*3/uL (ref 0.7–4.0)
LYMPHS PCT: 40 % (ref 12–46)
MCH: 30.6 pg (ref 26.0–34.0)
MCHC: 34.1 g/dL (ref 30.0–36.0)
MCV: 89.7 fL (ref 78.0–100.0)
Monocytes Absolute: 0.5 10*3/uL (ref 0.1–1.0)
Monocytes Relative: 10 % (ref 3–12)
Neutro Abs: 2.1 10*3/uL (ref 1.7–7.7)
Neutrophils Relative %: 43 % (ref 43–77)
PLATELETS: 234 10*3/uL (ref 150–400)
RBC: 5.16 MIL/uL (ref 4.22–5.81)
RDW: 13.5 % (ref 11.5–15.5)
WBC: 5 10*3/uL (ref 4.0–10.5)

## 2013-09-28 LAB — BASIC METABOLIC PANEL
Anion gap: 14 (ref 5–15)
BUN: 10 mg/dL (ref 6–23)
CHLORIDE: 103 meq/L (ref 96–112)
CO2: 24 meq/L (ref 19–32)
Calcium: 9 mg/dL (ref 8.4–10.5)
Creatinine, Ser: 1.07 mg/dL (ref 0.50–1.35)
GFR calc Af Amer: 81 mL/min — ABNORMAL LOW (ref >90)
GFR, EST NON AFRICAN AMERICAN: 70 mL/min — AB (ref >90)
GLUCOSE: 110 mg/dL — AB (ref 70–99)
POTASSIUM: 3.7 meq/L (ref 3.7–5.3)
SODIUM: 141 meq/L (ref 137–147)

## 2013-09-28 MED ORDER — PREDNISONE 10 MG PO TABS: 40.0000 mg | ORAL_TABLET | Freq: Every day | ORAL | Status: DC

## 2013-09-28 MED ORDER — DIPHENHYDRAMINE HCL 50 MG/ML IJ SOLN
25.0000 mg | Freq: Once | INTRAMUSCULAR | Status: AC
  Administered 2013-09-28: 25 mg via INTRAVENOUS
  Filled 2013-09-28: qty 1

## 2013-09-28 MED ORDER — FAMOTIDINE 20 MG PO TABS: 20.0000 mg | ORAL_TABLET | Freq: Two times a day (BID) | ORAL | Status: DC

## 2013-09-28 MED ORDER — SODIUM CHLORIDE 0.9 % IV SOLN
1000.0000 mL | INTRAVENOUS | Status: DC
  Administered 2013-09-28: 1000 mL via INTRAVENOUS

## 2013-09-28 MED ORDER — METHYLPREDNISOLONE SODIUM SUCC 125 MG IJ SOLR
125.0000 mg | Freq: Once | INTRAMUSCULAR | Status: AC
  Administered 2013-09-28: 125 mg via INTRAVENOUS
  Filled 2013-09-28: qty 2

## 2013-09-28 MED ORDER — FAMOTIDINE IN NACL 20-0.9 MG/50ML-% IV SOLN
20.0000 mg | Freq: Once | INTRAVENOUS | Status: AC
  Administered 2013-09-28: 20 mg via INTRAVENOUS
  Filled 2013-09-28: qty 50

## 2013-09-28 MED ORDER — SODIUM CHLORIDE 0.9 % IV SOLN: INTRAVENOUS | Status: DC

## 2013-09-28 MED ORDER — DIPHENHYDRAMINE HCL 25 MG PO TABS: 25.0000 mg | ORAL_TABLET | Freq: Four times a day (QID) | ORAL | Status: DC

## 2013-09-28 NOTE — ED Notes (Signed)
Pt states he was working on the tractor today and now has broken out with a rash and feels sob. Pt does have a visible rash and is itching but is in nad at this time.

## 2013-09-28 NOTE — Discharge Instructions (Signed)
Return for any new or worsening symptoms. Return for any lip swelling or tongue swelling or trouble breathing. Take medications as directed. Only take Benadryl for the next 24 hours. Take the Pepcid for the next week and the prednisone for the next 5 days.  Resource guide provided below to help you find a primary care Dr. Arsenio Katz rate 65 he qualify for primary care Dr.   Emergency Department Resource Guide 1) Find a Doctor and Pay Out of Pocket Although you won't have to find out who is covered by your insurance plan, it is a good idea to ask around and get recommendations. You will then need to call the office and see if the doctor you have chosen will accept you as a new patient and what types of options they offer for patients who are self-pay. Some doctors offer discounts or will set up payment plans for their patients who do not have insurance, but you will need to ask so you aren't surprised when you get to your appointment.  2) Contact Your Local Health Department Not all health departments have doctors that can see patients for sick visits, but many do, so it is worth a call to see if yours does. If you don't know where your local health department is, you can check in your phone book. The CDC also has a tool to help you locate your state's health department, and many state websites also have listings of all of their local health departments.  3) Find a Walk-in Clinic If your illness is not likely to be very severe or complicated, you may want to try a walk in clinic. These are popping up all over the country in pharmacies, drugstores, and shopping centers. They're usually staffed by nurse practitioners or physician assistants that have been trained to treat common illnesses and complaints. They're usually fairly quick and inexpensive. However, if you have serious medical issues or chronic medical problems, these are probably not your best option.  No Primary Care Doctor: - Call Health  Connect at  [phone] - they can help you locate a primary care doctor that  accepts your insurance, provides certain services, etc. - Physician Referral Service- [phone]  Chronic Pain Problems: Organization         Address  Phone   Notes  [facility]  [phone] Patients need to be referred by their primary care doctor.   Medication Assistance: Organization         Address  Phone   Notes  [facility] Medication [facility] [address] [place] [place], [place] [address] [phone] --Must be a resident of [facility] -- Must have NO insurance coverage whatsoever (no Medicaid/ Medicare, etc.) -- The pt. MUST have a primary care doctor that directs their care regularly and follows them in the community   [facility]ist  [phone]   [employer]  [phone]    Agencies that provide inexpensive medical care: Organization         Address  Phone   Notes  [facility]  [phone]   [facility]    [phone]   [facility] [address] [place], [place] [address] [phone]   [facility] [address] [place] [place] [phone]   Planned Parenthood    [phone]   [facility]    [phone]   [facility]  [phone]  [address], [place] Phone:  [phone], Fax:  [phone] Hours of Operation:  9 am - 6 pm, M-F.  Also accepts Medicaid/Medicare and self-pay.  [facility] for [place], [address], [place] Phone: [phone], Fax: [phone]. Hours of Operation:  8:30 am - 5:30 pm, M-F.  Also accepts Medicaid and self-pay.  [facility] [place] [address] [place] Phone: [phone]   [place], [place], [place] [phone], Ext. [phone] Mondays & Thursdays: 7-9 AM.  First 15 patients are seen on a first come, first serve basis.     [place] Providers:  Organization         Address  Phone   Notes  [facility] [address], [place] [phone] Also accepts self-pay patients.  [facility] [address], [place]  [phone]   [place], [address], [place] [phone]   [facility] [address], [place] [phone]   Lucianne Lei [address], [place]   [phone] Only accepts [place] Access [place] patients after they have their name applied to their card.   Self-Pay (no insurance) in [facility]:  Organization         Address  Phone   Notes  Sickle Cell Patients, [facility] Internal Medicine [place] [phone]   [facility] [place] [phone]   [facility] [place]  [place], [place],  [phone]   [facility]/Dr. Osei-Bonsu  [address], [place] or [address], [place] [phone] Phone number for both [place] and [place] locations is the same.  [facility]gent [facility] and [facility] [address] [place] [phone]   [facility] [address] [place] or [address] [phone]   [facility] [address] [place] [phone], phone; [phone], fax Sees patients 1st and 3rd Saturday of every month.  Must not qualify for public or private insurance (i.e. Medicaid, Medicare, [place] Health Choice, Veterans' Benefits)  Household income should be no more than 200% of the poverty level The clinic cannot treat you if you are pregnant or think you are pregnant  Sexually transmitted diseases are not treated at the clinic.    Dental Care: Organization         Address  Phone  Notes  [facility]  Department of [facility] [place] [phone] Accepts children up to age 49 who are enrolled in [place] or [place]; pregnant women with a Medicaid card; and children who have applied for Medicaid or [place] Health Choice, but were declined, whose parents can pay a reduced fee at time of service.  [facility] Department of [facility]  [address], [place] [phone] Accepts children up to age 71 who are enrolled in [place] or [place]; pregnant women with a Medicaid card; and children who have applied for Medicaid or [place] Health Choice, but were declined, whose parents can pay a reduced fee at time of service.  [facility] Adult Dental Access PROGRAM  [place], [place] [phone] Patients  are seen by appointment only. Walk-ins are not accepted. [place] will see patients 23 years of age and older. Monday - Tuesday (8am-5pm) Most Wednesdays (8:30-5pm) $30 per visit, cash only  [facility] Adult Dental Access PROGRAM  [address], [facility] [phone] Patients are seen by appointment only. Walk-ins are not accepted. [place] will see patients 32 years of age and older. One Wednesday Evening (Monthly: Volunteer Based).  $30 per visit, cash only  [place]  [phone] for adults; Children under age 44, call Graduate Pediatric Dentistry at [phone]. Children aged 91-14, please call [phone] to request a pediatric application.  Dental services are provided in all areas of dental care including fillings, crowns and bridges, complete and partial dentures, implants, gum treatment, root canals, and extractions. Preventive care is also provided. Treatment is provided to both adults and children. Patients are selected via a lottery and there is often a waiting list.   [facility] [address], [place]  [phone]  [email or web address]   [facility] [address] [place], [place] [phone], Ext. [phone] Second and Fourth Thursday of each month, opens at 6:30 AM; Clinic ends at 9 AM.  Patients are seen on a first-come first-served basis, and a limited number are seen during each clinic.   [facility]  [address] Hillard Danker [place], [place] [phone]   Eligibility Requirements You must have lived in [place], [place], or [place] counties for at least the last three months.   You cannot be eligible for state or federal sponsored [employer], including [employer], [place], or [employer].   You generally cannot be eligible for healthcare insurance through your employer.    How to apply: Eligibility screenings are held every Tuesday and Wednesday afternoon from 1:00 pm until 4:00 pm. You do not need an appointment for the interview!  [facility] [address], [place], [place]   [place]  [place] Department  [place]  [phone]    Behavioral Health Resources in the Community: Intensive Outpatient Programs Organization         Address  Phone  Notes  [place]. [address] [place] [address], [place], [place] [phone]   [facility] Outpatient [address], [place], [place]   ADS: [facility] Drug [facility] [address], [place], [place]   [place] [address],  [place], [place] or [phone]   Substance Abuse Resources Organization         Address  Phone  Notes  Al[facility] Drug Services  [phone]   [place]  [phone]   [facility] [place]   Chinita Pester  [phone]   Residential & Outpatient Substance Abuse Program  [phone]   Psychological Services Organization          Address  Phone  Notes  [facility]  [place]  [phone]   [place] [address] [place] or [phone]    Mobile Crisis Teams Organization         Address  Phone  Notes  [facility], Mobile Crisis Care Unit  [phone]   [facility]  [address] [place], [place]   [facility] [address] [place] 18  [place] [phone]    Self-Help/Support Groups Organization         Address  Phone             Notes  [facility] of [place] - variety of support groups  336- [phone] Call for more information  Narcotics Anonymous (NA), [facility] [address], [place]  2 meetings at this location   Statisticianesidential Treatment Programs Organization         Address  Phone  Notes  [facility] [address],    [place]  [phone]   [facility]  [address], [place] [phone], [place], [place] [phone]   [facility] [address], [place] [phone] Admissions: 8am-3pm M-F  [facility] [address]    [place], [place] [phone]   [facility] [address], [place], [place] [phone]   [facility] [address]  [place], [place] [phone]   [facility] - Intensive Outpatient [address], [place], [place] [phone]   [facility] ([facility] [address]  [place], [place] [phone] or [phone]   [facility] (RTS) [address] [place], [place] [phone] Accepts Medicaid  [facility] [address]  [place] [phone] Substance Abuse/Addiction Treatment   [facility] Behavioral Health Resources Organization         Address  Phone  Notes  [facility]  [phone](888) [phone]   Angie FavaJulie Brannon, PhD [address], [address] [place], [place]   [phone]) [phone] or [phone]) [phone]   [facility]   [address] [place], [place] [phone](336) [phone]   [facility] [address], [place], [place] [phone]) [phone] Insurance/Medicaid/sponsorship through [facility]  [facility] [address]                                    [place], [place] [phone]) [phone] Therapy/tele-psych/case  [facility] [address]   [place], [place] [phone](336) [phone]    Dr. Lolly MustacheArfeen  [phone]   [facility]  [employer] [facility] 1) [address], [place] 2) [address], [place] 3)  [address], [place] [phone]  [phone](336) [phone]   [facility] Child Abuse Hotline [phone](336) 9344489353 or [phone](336) [phone] (After Hours)

## 2013-09-28 NOTE — ED Provider Notes (Signed)
CSN: [id number]     Arrival date & time 09/28/13  2033 History   First MD Initiated Contact with Patient 09/28/13 2154     Chief Complaint  Patient presents with  . Allergic Reaction     (Consider location/radiation/quality/duration/timing/severity/associated sxs/prior Treatment) Patient is a 68 y.o. male presenting with allergic reaction. The history is provided by the patient.  Allergic Reaction Presenting symptoms: rash   Presenting symptoms: no difficulty swallowing    patient was sudden onset of rash itchy around 7:00 in the evening. Patient had the same thing happen in July. Stated heparin we get grossly likely due today. No known cause. Some difficulty swallowing but no tongue or lip swelling. No shortness of breath. In July patient was treated with Benadryl and prednisone with resolution. Patient currently does not have a primary care Dr.  History reviewed. No pertinent past medical history. Past Surgical History  Procedure Laterality Date  . Back surgery      3 PROCEDURES    History reviewed. No pertinent family history. History  Substance Use Topics  . Smoking status: Current Every Day Smoker  . Smokeless tobacco: Not on file  . Alcohol Use: No    Review of Systems  Constitutional: Negative for fever.  HENT: Negative for congestion, drooling and trouble swallowing.   Eyes: Negative for visual disturbance.  Respiratory: Negative for shortness of breath.   Cardiovascular: Negative for chest pain.  Gastrointestinal: Negative for abdominal pain.  Genitourinary: Negative for dysuria.  Musculoskeletal: Negative for myalgias.  Skin: Positive for rash.  Neurological: Negative for headaches.  Hematological: Does not bruise/bleed easily.  Psychiatric/Behavioral: Negative for confusion.      Allergies  Review of patient's allergies indicates no known allergies.  Home Medications   Prior to Admission medications   Medication Sig Start Date End Date Taking?  Authorizing Provider  aspirin 325 MG tablet Take 650 mg by mouth once.   Yes Historical Provider, MD  naproxen sodium (ANAPROX) 220 MG tablet Take 220 mg by mouth 2 (two) times daily with a meal.   Yes Historical Provider, MD  diphenhydrAMINE (BENADRYL) 25 MG tablet Take 1 tablet (25 mg total) by mouth every 6 (six) hours. 09/28/13   Vanetta Mulders, MD  famotidine (PEPCID) 20 MG tablet Take 1 tablet (20 mg total) by mouth 2 (two) times daily. 09/28/13   Vanetta Mulders, MD  predniSONE (DELTASONE) 10 MG tablet Take 4 tablets (40 mg total) by mouth daily. 09/28/13   Vanetta Mulders, MD   BP 101/64  Pulse 71  Temp(Src) 98.7 F (37.1 C)  Resp 20  Ht 6\' 2"  (1.88 m)  Wt 222 lb (100.699 kg)  BMI 28.49 kg/m2  SpO2 94% Physical Exam  Nursing note and vitals reviewed. Constitutional: He is oriented to person, place, and time. He appears well-developed and well-nourished. No distress.  HENT:  Head: Normocephalic and atraumatic.  Mouth/Throat: Oropharynx is clear and moist.  No tongue or lip swelling. No trouble swallowing.  Eyes: Conjunctivae are normal. Pupils are equal, round, and reactive to light.  Neck: Normal range of motion. Neck supple. No tracheal deviation present.  Cardiovascular: Normal rate, regular rhythm and normal heart sounds.   Pulmonary/Chest: Effort normal and breath sounds normal. No stridor. No respiratory distress. He has no wheezes.  Abdominal: Soft. Bowel sounds are normal. There is no tenderness.  Musculoskeletal: Normal range of motion. He exhibits no edema.  Neurological: He is alert and oriented to person, place, and time. No cranial nerve deficit.  He exhibits normal muscle tone. Coordination normal.  Skin: Skin is warm. Rash noted.  Urticaria hives per dominant male upper extremities and abdomen.    ED Course  Procedures (including critical care time) Labs Review Labs Reviewed  BASIC METABOLIC PANEL - Abnormal; Notable for the following:    Glucose, Bld 110 (*)     GFR calc non Af Amer 70 (*)    GFR calc Af Amer 81 (*)    All other components within normal limits  CBC WITH DIFFERENTIAL - Abnormal; Notable for the following:    Eosinophils Relative 7 (*)    All other components within normal limits   Results for orders placed during the hospital encounter of 09/28/13  BASIC METABOLIC PANEL      Result Value Ref Range   Sodium 141  137 - 147 mEq/L   Potassium 3.7  3.7 - 5.3 mEq/L   Chloride 103  96 - 112 mEq/L   CO2 24  19 - 32 mEq/L   Glucose, Bld 110 (*) 70 - 99 mg/dL   BUN 10  6 - 23 mg/dL   Creatinine, Ser 2.951.07  0.50 - 1.35 mg/dL   Calcium 9.0  8.4 - 62.110.5 mg/dL   GFR calc non Af Amer 70 (*) >90 mL/min   GFR calc Af Amer 81 (*) >90 mL/min   Anion gap 14  5 - 15  CBC WITH DIFFERENTIAL      Result Value Ref Range   WBC 5.0  4.0 - 10.5 K/uL   RBC 5.16  4.22 - 5.81 MIL/uL   Hemoglobin 15.8  13.0 - 17.0 g/dL   HCT 30.846.3  65.739.0 - 84.652.0 %   MCV 89.7  78.0 - 100.0 fL   MCH 30.6  26.0 - 34.0 pg   MCHC 34.1  30.0 - 36.0 g/dL   RDW 96.213.5  95.211.5 - 84.115.5 %   Platelets 234  150 - 400 K/uL   Neutrophils Relative % 43  43 - 77 %   Neutro Abs 2.1  1.7 - 7.7 K/uL   Lymphocytes Relative 40  12 - 46 %   Lymphs Abs 2.0  0.7 - 4.0 K/uL   Monocytes Relative 10  3 - 12 %   Monocytes Absolute 0.5  0.1 - 1.0 K/uL   Eosinophils Relative 7 (*) 0 - 5 %   Eosinophils Absolute 0.3  0.0 - 0.7 K/uL   Basophils Relative 0  0 - 1 %   Basophils Absolute 0.0  0.0 - 0.1 K/uL     Imaging Review No results found.   EKG Interpretation None      MDM   Final diagnoses:  Hives    Patient with unknown allergic reaction. A similar event back in July. Patient states when he gets sweaty sometimes he breaks out in hives. Patient had a little bit tightness in the throat that has resolved never had any lip or tongue swelling. Never had any wheezing. Patient treated here with Benadryl site Medrol and Pepcid and all hives have resolved. Patient currently does not have a  regular doctor. Resource guide provided. Patient will be continued on Benadryl for the next 24 hours Pepcid for the next 7 days and prednisone for the next 5 days.. Summer the treatment course he had in July. Patient will return for any new or worse symptoms.    Vanetta MuldersScott Nakira Litzau, MD 09/28/13 [phone]

## 2013-10-27 ENCOUNTER — Observation Stay (HOSPITAL_COMMUNITY)
Admission: EM | Admit: 2013-10-27 | Discharge: 2013-10-28 | Disposition: A | Discharge status: Home / Self Care | Payer: Medicare Other | Attending: Orthopedic Surgery | Admitting: Orthopedic Surgery

## 2013-10-27 ENCOUNTER — Encounter (HOSPITAL_COMMUNITY): Payer: Self-pay | Admitting: Emergency Medicine

## 2013-10-27 ENCOUNTER — Emergency Department (HOSPITAL_COMMUNITY): Payer: Medicare Other

## 2013-10-27 DIAGNOSIS — W208XXA Other cause of strike by thrown, projected or falling object, initial encounter: Secondary | ICD-10-CM | POA: Diagnosis not present

## 2013-10-27 DIAGNOSIS — S92213A Displaced fracture of cuboid bone of unspecified foot, initial encounter for closed fracture: Secondary | ICD-10-CM | POA: Diagnosis not present

## 2013-10-27 DIAGNOSIS — S99919A Unspecified injury of unspecified ankle, initial encounter: Secondary | ICD-10-CM | POA: Diagnosis present

## 2013-10-27 DIAGNOSIS — F17200 Nicotine dependence, unspecified, uncomplicated: Secondary | ICD-10-CM | POA: Insufficient documentation

## 2013-10-27 DIAGNOSIS — S91309A Unspecified open wound, unspecified foot, initial encounter: Secondary | ICD-10-CM | POA: Insufficient documentation

## 2013-10-27 DIAGNOSIS — S91312A Laceration without foreign body, left foot, initial encounter: Secondary | ICD-10-CM

## 2013-10-27 DIAGNOSIS — S92309A Fracture of unspecified metatarsal bone(s), unspecified foot, initial encounter for closed fracture: Secondary | ICD-10-CM | POA: Diagnosis not present

## 2013-10-27 DIAGNOSIS — S9780XA Crushing injury of unspecified foot, initial encounter: Principal | ICD-10-CM | POA: Insufficient documentation

## 2013-10-27 DIAGNOSIS — S8990XA Unspecified injury of unspecified lower leg, initial encounter: Secondary | ICD-10-CM | POA: Diagnosis present

## 2013-10-27 DIAGNOSIS — S92212A Displaced fracture of cuboid bone of left foot, initial encounter for closed fracture: Secondary | ICD-10-CM

## 2013-10-27 DIAGNOSIS — S9782XA Crushing injury of left foot, initial encounter: Secondary | ICD-10-CM

## 2013-10-27 LAB — BASIC METABOLIC PANEL
ANION GAP: 11 (ref 5–15)
BUN: 11 mg/dL (ref 6–23)
CHLORIDE: 103 meq/L (ref 96–112)
CO2: 24 mEq/L (ref 19–32)
Calcium: 9.4 mg/dL (ref 8.4–10.5)
Creatinine, Ser: 0.98 mg/dL (ref 0.50–1.35)
GFR calc non Af Amer: 83 mL/min — ABNORMAL LOW (ref >90)
Glucose, Bld: 146 mg/dL — ABNORMAL HIGH (ref 70–99)
Potassium: 4.7 mEq/L (ref 3.7–5.3)
SODIUM: 138 meq/L (ref 137–147)

## 2013-10-27 LAB — CBC
HEMATOCRIT: 43.5 % (ref 39.0–52.0)
Hemoglobin: 14.9 g/dL (ref 13.0–17.0)
MCH: 30.8 pg (ref 26.0–34.0)
MCHC: 34.3 g/dL (ref 30.0–36.0)
MCV: 89.9 fL (ref 78.0–100.0)
PLATELETS: 217 10*3/uL (ref 150–400)
RBC: 4.84 MIL/uL (ref 4.22–5.81)
RDW: 13.2 % (ref 11.5–15.5)
WBC: 11.3 10*3/uL — AB (ref 4.0–10.5)

## 2013-10-27 MED ORDER — HYDROCODONE-ACETAMINOPHEN 5-325 MG PO TABS
2.0000 | ORAL_TABLET | Freq: Once | ORAL | Status: DC
Start: 2013-10-27 — End: 2013-10-27

## 2013-10-27 MED ORDER — ONDANSETRON HCL 4 MG PO TABS: 4.0000 mg | ORAL_TABLET | Freq: Four times a day (QID) | ORAL | Status: DC | PRN

## 2013-10-27 MED ORDER — CEPHALEXIN 500 MG PO CAPS: 500.0000 mg | ORAL_CAPSULE | Freq: Three times a day (TID) | ORAL | Status: DC

## 2013-10-27 MED ORDER — METOCLOPRAMIDE HCL 5 MG/ML IJ SOLN: 5.0000 mg | Freq: Three times a day (TID) | INTRAMUSCULAR | Status: DC | PRN

## 2013-10-27 MED ORDER — METOCLOPRAMIDE HCL 10 MG PO TABS: 5.0000 mg | ORAL_TABLET | Freq: Three times a day (TID) | ORAL | Status: DC | PRN

## 2013-10-27 MED ORDER — HYDROCODONE-ACETAMINOPHEN 5-325 MG PO TABS: 2.0000 | ORAL_TABLET | ORAL | Status: DC | PRN

## 2013-10-27 MED ORDER — FAMOTIDINE 20 MG PO TABS
20.0000 mg | ORAL_TABLET | Freq: Every day | ORAL | Status: DC
  Administered 2013-10-28: 20 mg via ORAL
  Filled 2013-10-27: qty 1

## 2013-10-27 MED ORDER — HYDROMORPHONE HCL 1 MG/ML IJ SOLN
0.5000 mg | INTRAMUSCULAR | Status: DC | PRN
  Administered 2013-10-28 (×2): 1 mg via INTRAVENOUS
  Filled 2013-10-27 (×2): qty 1

## 2013-10-27 MED ORDER — SODIUM CHLORIDE 0.9 % IV SOLN
INTRAVENOUS | Status: DC
  Administered 2013-10-27: 23:00:00 via INTRAVENOUS

## 2013-10-27 MED ORDER — ONDANSETRON HCL 4 MG/2ML IJ SOLN: 4.0000 mg | Freq: Four times a day (QID) | INTRAMUSCULAR | Status: DC | PRN

## 2013-10-27 MED ORDER — TETANUS-DIPHTH-ACELL PERTUSSIS 5-2.5-18.5 LF-MCG/0.5 IM SUSP
0.5000 mL | Freq: Once | INTRAMUSCULAR | Status: AC
  Administered 2013-10-27: 0.5 mL via INTRAMUSCULAR
  Filled 2013-10-27: qty 0.5

## 2013-10-27 MED ORDER — FENTANYL CITRATE 0.05 MG/ML IJ SOLN
50.0000 ug | Freq: Once | INTRAMUSCULAR | Status: AC
  Administered 2013-10-27: 50 ug via INTRAVENOUS
  Filled 2013-10-27: qty 2

## 2013-10-27 MED ORDER — CEFAZOLIN SODIUM 1-5 GM-% IV SOLN
1.0000 g | Freq: Once | INTRAVENOUS | Status: AC
  Administered 2013-10-27: 1 g via INTRAVENOUS
  Filled 2013-10-27: qty 50

## 2013-10-27 MED ORDER — LIDOCAINE HCL (PF) 1 % IJ SOLN
30.0000 mL | Freq: Once | INTRAMUSCULAR | Status: AC
  Administered 2013-10-27: 30 mL via INTRADERMAL
  Filled 2013-10-27: qty 30

## 2013-10-27 MED ORDER — SODIUM CHLORIDE 0.9 % IV SOLN
Freq: Once | INTRAVENOUS | Status: AC
  Administered 2013-10-27: 17:00:00 via INTRAVENOUS

## 2013-10-27 MED ORDER — MORPHINE SULFATE 4 MG/ML IJ SOLN: 4.0000 mg | INTRAMUSCULAR | Status: DC | PRN

## 2013-10-27 MED ORDER — CEFAZOLIN SODIUM-DEXTROSE 2-3 GM-% IV SOLR
2.0000 g | Freq: Four times a day (QID) | INTRAVENOUS | Status: AC
  Administered 2013-10-27 – 2013-10-28 (×3): 2 g via INTRAVENOUS
  Filled 2013-10-27 (×3): qty 50

## 2013-10-27 MED ORDER — DIPHENHYDRAMINE HCL 25 MG PO CAPS
25.0000 mg | ORAL_CAPSULE | Freq: Four times a day (QID) | ORAL | Status: DC | PRN
  Administered 2013-10-27: 25 mg via ORAL
  Filled 2013-10-27 (×2): qty 1

## 2013-10-27 MED ORDER — OXYCODONE-ACETAMINOPHEN 5-325 MG PO TABS
1.0000 | ORAL_TABLET | ORAL | Status: DC | PRN
  Administered 2013-10-27: 2 via ORAL
  Administered 2013-10-28: 1 via ORAL
  Administered 2013-10-28: 2 via ORAL
  Filled 2013-10-27: qty 2
  Filled 2013-10-27: qty 1
  Filled 2013-10-27: qty 2

## 2013-10-27 NOTE — Progress Notes (Signed)
Orthopedic Tech Progress Note Patient Details:  [name] [birth year] [id number]  Ortho Devices Type of Ortho Device: CAM walker;Crutches Ortho Device/Splint Location: lle Ortho Device/Splint Interventions: Application   Nikki Dom 10/27/2013, 7:39 PM

## 2013-10-27 NOTE — Progress Notes (Signed)
10/27/2013 A. Candice Lunney RNCM 2234pm [facility] faxed home health orders for RN, PT, and Aide with face to face to [facility] at 2231pm with confirmation of receipt at 2238pm.  Patient requires CM follow up for equipment at home.  Please contact CM for home health needs. No further EDCM needs at this time.

## 2013-10-27 NOTE — Progress Notes (Signed)
10/27/2013 A. Kurt Hoffmeier RNCM [id number] EDCM spoke to [facility] for foollow up.  Order placed by EDP for consult to admit.

## 2013-10-27 NOTE — ED Notes (Signed)
[name] Case management made consulted sts can have home health nurse come visit. Patient made aware sts does not feel comfortable going home- pt unable to take care of self, and does not feel comfortable walking in CAM walker. Dr. Jodi Mourning made aware.   Report given to [facility]

## 2013-10-27 NOTE — Progress Notes (Signed)
10/27/2013 A. Ashton Belote RNCM 2221pm EDCM left voice message on CM after hours voice mail for patient follow up.

## 2013-10-27 NOTE — H&P (Signed)
[name] is an 68 y.o. male.   Chief Complaint: Left foot crush injury HPI: 68 yo male who was cutting a tree down today when the hickory tree that he was cutting slid off and crushed his left foot. Presented with obvious swelling and open injury to the foot. No other complaints.  History reviewed. No pertinent past medical history.  Past Surgical History  Procedure Laterality Date  . Back surgery      3 PROCEDURES     No family history on file. Social History:  reports that he has been smoking.  He does not have any smokeless tobacco history on file. He reports that he does not drink alcohol or use illicit drugs.  Allergies:  Allergies  Allergen Reactions  . Tomato Itching     (Not in a hospital admission)  Results for orders placed during the hospital encounter of 10/27/13 (from the past 48 hour(s))  CBC     Status: Abnormal   Collection Time    10/27/13  3:27 PM      Result Value Ref Range   WBC 11.3 (*) 4.0 - 10.5 K/uL   RBC 4.84  4.22 - 5.81 MIL/uL   Hemoglobin 14.9  13.0 - 17.0 g/dL   HCT 43.5  39.0 - 52.0 %   MCV 89.9  78.0 - 100.0 fL   MCH 30.8  26.0 - 34.0 pg   MCHC 34.3  30.0 - 36.0 g/dL   RDW 13.2  11.5 - 15.5 %   Platelets 217  150 - 400 K/uL  BASIC METABOLIC PANEL     Status: Abnormal   Collection Time    10/27/13  3:27 PM      Result Value Ref Range   Sodium 138  137 - 147 mEq/L   Potassium 4.7  3.7 - 5.3 mEq/L   Chloride 103  96 - 112 mEq/L   CO2 24  19 - 32 mEq/L   Glucose, Bld 146 (*) 70 - 99 mg/dL   BUN 11  6 - 23 mg/dL   Creatinine, Ser 0.98  0.50 - 1.35 mg/dL   Calcium 9.4  8.4 - 10.5 mg/dL   GFR calc non Af Amer 83 (*) >90 mL/min   GFR calc Af Amer >90  >90 mL/min   Comment: (NOTE)     The eGFR has been calculated using the CKD EPI equation.     This calculation has not been validated in all clinical situations.     eGFR's persistently <90 mL/min signify possible Chronic Kidney     Disease.   Anion gap 11  5 - 15   Dg Tibia/fibula  Left  10/27/2013   CLINICAL DATA:  Lacerations to the left lower leg. A tree fell upon the left lower leg while the patient was cutting down. Initial encounter.  EXAM: LEFT TIBIA AND FIBULA - 2 VIEW  COMPARISON:  Left knee and left ankle x-rays obtained concurrently.  FINDINGS: No acute fractures involving the tibia or fibula. Well preserved bone mineral density. Please see the concurrent reports of the left knee and left ankle imaging.  IMPRESSION: No acute fractures involving the tibia or fibula.   Electronically Signed   By: Evangeline Dakin M.D.   On: 10/27/2013 16:47   Dg Ankle Complete Left  10/27/2013   CLINICAL DATA:  Lacerations to the left lower leg. A tree fell upon the left lower leg while the patient was cutting down. Initial encounter.  EXAM: LEFT ANKLE COMPLETE -  3+ VIEW  COMPARISON:  Left tibia fibula x-rays obtained concurrently.  FINDINGS: Gas within the anterior soft tissues of the lower leg extending into the dorsum of the foot. Dorsal and lateral soft tissue swelling. No evidence of acute fracture or dislocation. Ankle mortise intact. Mild hypertrophic spurring involving the distal tibia at the ankle joint. Well preserved bone mineral density. Atherosclerotic calcification involving the peroneal artery.  IMPRESSION: Soft tissue injury. No acute osseous abnormality. Mild osteoarthritis.   Electronically Signed   By: Evangeline Dakin M.D.   On: 10/27/2013 16:42   Ct Foot Left Wo Contrast  10/27/2013   CLINICAL DATA:  Status post direct trauma to the foot from a tree falling on the extremity  EXAM: CT OF THE LEFT FOOT WITHOUT CONTRAST  TECHNIQUE: Multidetector CT imaging of the left foot was performed according to the standard protocol. Multiplanar CT image reconstructions were also generated.  COMPARISON:  Foot series of today's date  FINDINGS: The soft tissues of the foot reveal diffuse edema as well as gas within the deep musculature of the plantar aspect of the foot as well as between  the second and third metatarsals and within the soft tissues over the dorsum of the metatarsal region.  There is an acute fracture through the posterior medial aspect of the cuboid. The navicular and cuneiforms are intact. The talus and calcaneus are intact. There is a plantar calcaneal spur. There is a fracture involving the medial and dorsal aspect of the base of the third metatarsal. There is moderate degenerative change of the first metatarsal phalangeal joint. The phalanges are intact.  IMPRESSION: 1. There is an acute fracture of the cuboid and of the base of the third metatarsal. 2. There is diffuse soft tissue swelling, and there is soft tissue gas within the midfoot dorsally and within the deep plantar soft tissues. No definite foreign body is demonstrated. The patient's injury is presumed to be acute. If it is subacute, these findings could reflect the presence of severe infection. 3. The phalanges are intact.   Electronically Signed   By: David  [place]   On: 10/27/2013 17:54   Dg Foot Complete Left  10/27/2013   CLINICAL DATA:  Foot injury with laceration  EXAM: LEFT FOOT - COMPLETE 3+ VIEW  COMPARISON:  None.  FINDINGS: Soft tissue injury with gas in the soft tissues dorsal to the ankle and foot. Soft tissue injury noted.  Negative for fracture or foreign body. Advanced degenerative change in the first MTP joint.  IMPRESSION: Soft tissue injury with laceration and gas in the tissues. No acute bony abnormality   Electronically Signed   By: Franchot Gallo M.D.   On: 10/27/2013 16:40    ROS  Blood pressure 124/78, pulse 80, temperature 97.6 F (36.4 C), temperature source Oral, resp. rate 21, height 6' 2"  (1.88 m), weight 101.152 kg (223 lb), SpO2 100.00%. Physical Exam AAO, moderate distress, left LE with swollen left foot with multiple lacerations that have been sutured all on the medial side of the foot.  There is moderate swelling and bruising on the lateral side of the foot.  There is an  obvious great toe nail crush injury as well. Venous bleeding noted from a dorsal puncture wound to the midfoot and from the great toe nailbed.  Assessment/Plan Left foot crush injury with nondisplaced cuboid fracture and great toe nailbed injury which is decompressed. No evidence for compartment syndrome.  Patient has had tetanus booster and IV ancef already.  Due  to the fact that the patient lives alone and is unable to care for himself, we will admit him for pain control, dressing changes, PT/mobilization, and D/C planning. D/C planned for tomorrow if we can get patient mobile and home health set up.  Nicholos Aloisi,STEVEN R 10/27/2013, 8:57 PM

## 2013-10-27 NOTE — ED Provider Notes (Addendum)
CSN: [id number]     Arrival date & time 10/27/13  1426 History   First MD Initiated Contact with Patient 10/27/13 1508     Chief Complaint  Patient presents with  . Foot Injury     (Consider location/radiation/quality/duration/timing/severity/associated sxs/prior Treatment) HPI Comments: 68 year old male with history of arthritis, chains injury to left ankle, nerve injury to left foot presents after a tree fell on his left foot prior to arrival. Patient had to use cloth hammer to remove his foot from the large tree trunk which weighed over 1000 pounds. Patient has penetration injury to dorsal part and lacerations to the medial aspect. Patient's pain is controlled currently after receiving morphine IV on route. Unknown tetanus. Pain with any palpation range of motion. No other injuries no syncope chest pain or headache. Patient was using a chainsaw however the chainsaw did not touch the patient.  Patient is a 68 y.o. male presenting with foot injury. The history is provided by the patient.  Foot Injury Associated symptoms: no back pain, no fever and no neck pain     History reviewed. No pertinent past medical history. Past Surgical History  Procedure Laterality Date  . Back surgery      3 PROCEDURES    No family history on file. History  Substance Use Topics  . Smoking status: Current Every Day Smoker -- 1.00 packs/day for 55 years  . Smokeless tobacco: Not on file  . Alcohol Use: No    Review of Systems  Constitutional: Negative for fever and chills.  HENT: Negative for congestion.   Eyes: Negative for visual disturbance.  Respiratory: Negative for shortness of breath.   Cardiovascular: Negative for chest pain.  Gastrointestinal: Negative for vomiting and abdominal pain.  Genitourinary: Negative for dysuria and flank pain.  Musculoskeletal: Positive for gait problem and joint swelling. Negative for back pain, neck pain and neck stiffness.  Skin: Positive for wound. Negative  for rash.  Neurological: Positive for numbness. Negative for weakness, light-headedness and headaches.      Allergies  Tomato  Home Medications   Prior to Admission medications   Medication Sig Start Date End Date Taking? Authorizing Provider  diphenhydrAMINE (BENADRYL) 25 MG tablet Take 1 tablet (25 mg total) by mouth every 6 (six) hours. 09/28/13  Yes Vanetta Mulders, MD  famotidine (PEPCID) 20 MG tablet Take 1 tablet (20 mg total) by mouth 2 (two) times daily. 09/28/13  Yes Vanetta Mulders, MD   BP 113/67  Pulse 82  Temp(Src) 97.6 F (36.4 C) (Oral)  Resp 18  Ht  (1.88 m)  Wt 223 lb (101.152 kg)  BMI 28.62 kg/m2  SpO2 98% Physical Exam  Nursing note and vitals reviewed. Constitutional: He is oriented to person, place, and time. He appears well-developed and well-nourished.  HENT:  Head: Normocephalic and atraumatic.  Eyes: Conjunctivae are normal. Right eye exhibits no discharge. Left eye exhibits no discharge.  Neck: Normal range of motion. Neck supple. No tracheal deviation present.  Cardiovascular: Normal rate.   Pulmonary/Chest: Effort normal.  Abdominal: Soft. He exhibits no distension. There is no tenderness.  Musculoskeletal: He exhibits edema and tenderness.  Neurological: He is alert and oriented to person, place, and time. No cranial nerve deficit.  Skin: Skin is warm.  Patient had significant swelling, compartment still soft and compressible  left dorsum foot. Patient has significant tenderness to the mid dorsal aspect of the left foot. Dorsalis pedis pulse 2+. Patient has 3 gaping lacerations medial aspect of foot.  Patient has 2 puncture wounds dorsal aspect with continued bleeding. Patient has mild lower anterior tibia and anterior ankle tenderness to palpation. Patient is chronic scar on the lateral ankle and chronic numbness to the dorsum of the foot. Patient can move toes and ankle with mild discomfort.   Psychiatric: He has a normal mood and affect.     ED Course  Procedures (including critical care time) LACERATION REPAIR Performed by: Enid Skeens Authorized by: Enid Skeens Consent: Verbal consent obtained. Risks and benefits: risks, benefits and alternatives were discussed Consent given by: patient Patient identity confirmed: provided demographic data Prepped and Draped in normal sterile fashion Wound explored  Laceration Location: 4 cm Laceration Length: right medial foot No Foreign Bodies seen or palpated Anesthesia: local infiltration Local anesthetic: lidocaine 1% no epinephrine Anesthetic total: 4 ml Amount of cleaning: standard  Skin closure: approximated, complex Number of sutures: 3  Technique: interupted  Patient tolerance: Patient tolerated the procedure well with no immediate complications.  LACERATION REPAIR Performed by: Enid Skeens Authorized by: Enid Skeens Consent: Verbal consent obtained. Risks and benefits: risks, benefits and alternatives were discussed Consent given by: patient Patient identity confirmed: provided demographic data Prepped and Draped in normal sterile fashion Wound explored  Laceration Location: left foot medial Laceration Length: 3cm No Foreign Bodies seen or palpated Anesthesia: local infiltration Local anesthetic: lidocaine 1% no epinephrine Anesthetic total: 4 ml Amount of cleaning: standard  Skin closure: approximated Number of sutures: 3  Technique: interupted  Patient tolerance: Patient tolerated the procedure well with no immediate complications.  LACERATION REPAIR Performed by: Enid Skeens Authorized by: Enid Skeens Consent: Verbal consent obtained. Risks and benefits: risks, benefits and alternatives were discussed Consent given by: patient Patient identity confirmed: provided demographic data Prepped and Draped in normal sterile fashion Wound explored  Laceration Location: right foot Laceration Length: 2cm No Foreign  Bodies seen or palpated Anesthesia: local infiltration Local anesthetic: lidocaine 1% no epinephrine Anesthetic total: 3 ml Amount of cleaning: standard  Skin closure: approximated Number of sutures: 3  Technique: interupted  Patient tolerance: Patient tolerated the procedure well with no immediate complications.  LACERATION REPAIR Performed by: Enid Skeens Authorized by: Enid Skeens Consent: Verbal consent obtained. Risks and benefits: risks, benefits and alternatives were discussed Consent given by: patient Patient identity confirmed: provided demographic data Prepped and Draped in normal sterile fashion Wound explored  Laceration Location: right foot Laceration Length: 2cm No Foreign Bodies seen or palpated Anesthesia: local infiltration Local anesthetic: lidocaine 1% no epinephrine Anesthetic total: 3 ml Amount of cleaning: standard  Skin closure: approximated Number of sutures: 2  Technique: interupted Patient tolerance: Patient tolerated the procedure well with no immediate complications.  LACERATION REPAIR Performed by: Enid Skeens Authorized by: Enid Skeens Consent: Verbal consent obtained. Risks and benefits: risks, benefits and alternatives were discussed Consent given by: patient Patient identity confirmed: provided demographic data Prepped and Draped in normal sterile fashion Wound explored  Laceration Location: right distal medial foot Laceration Length: 1cm No Foreign Bodies seen or palpated Anesthesia: local infiltration Local anesthetic: lidocaine 1%  Anesthetic total: 2 ml Amount of cleaning: standard  Skin closure: approximated Number of sutures: 1  Technique: simple  Patient tolerance: Patient tolerated the procedure well with no immediate complications.   Labs Review Labs Reviewed  CBC - Abnormal; Notable for the following:    WBC 11.3 (*)    All other components within normal limits  BASIC  METABOLIC PANEL -  Abnormal; Notable for the following:    Glucose, Bld 146 (*)    GFR calc non Af Amer 83 (*)    All other components within normal limits    Imaging Review Dg Tibia/fibula Left  10/27/2013   CLINICAL DATA:  Lacerations to the left lower leg. A tree fell upon the left lower leg while the patient was cutting down. Initial encounter.  EXAM: LEFT TIBIA AND FIBULA - 2 VIEW  COMPARISON:  Left knee and left ankle x-rays obtained concurrently.  FINDINGS: No acute fractures involving the tibia or fibula. Well preserved bone mineral density. Please see the concurrent reports of the left knee and left ankle imaging.  IMPRESSION: No acute fractures involving the tibia or fibula.   Electronically Signed   By: Hulan Saas M.D.   On: 10/27/2013 16:47   Dg Ankle Complete Left  10/27/2013   CLINICAL DATA:  Lacerations to the left lower leg. A tree fell upon the left lower leg while the patient was cutting down. Initial encounter.  EXAM: LEFT ANKLE COMPLETE - 3+ VIEW  COMPARISON:  Left tibia fibula x-rays obtained concurrently.  FINDINGS: Gas within the anterior soft tissues of the lower leg extending into the dorsum of the foot. Dorsal and lateral soft tissue swelling. No evidence of acute fracture or dislocation. Ankle mortise intact. Mild hypertrophic spurring involving the distal tibia at the ankle joint. Well preserved bone mineral density. Atherosclerotic calcification involving the peroneal artery.  IMPRESSION: Soft tissue injury. No acute osseous abnormality. Mild osteoarthritis.   Electronically Signed   By: Hulan Saas M.D.   On: 10/27/2013 16:42   Ct Foot Left Wo Contrast  10/27/2013   CLINICAL DATA:  Status post direct trauma to the foot from a tree falling on the extremity  EXAM: CT OF THE LEFT FOOT WITHOUT CONTRAST  TECHNIQUE: Multidetector CT imaging of the left foot was performed according to the standard protocol. Multiplanar CT image reconstructions were also generated.  COMPARISON:  Foot  series of today's date  FINDINGS: The soft tissues of the foot reveal diffuse edema as well as gas within the deep musculature of the plantar aspect of the foot as well as between the second and third metatarsals and within the soft tissues over the dorsum of the metatarsal region.  There is an acute fracture through the posterior medial aspect of the cuboid. The navicular and cuneiforms are intact. The talus and calcaneus are intact. There is a plantar calcaneal spur. There is a fracture involving the medial and dorsal aspect of the base of the third metatarsal. There is moderate degenerative change of the first metatarsal phalangeal joint. The phalanges are intact.  IMPRESSION: 1. There is an acute fracture of the cuboid and of the base of the third metatarsal. 2. There is diffuse soft tissue swelling, and there is soft tissue gas within the midfoot dorsally and within the deep plantar soft tissues. No definite foreign body is demonstrated. The patient's injury is presumed to be acute. If it is subacute, these findings could reflect the presence of severe infection. 3. The phalanges are intact.   Electronically Signed   By: David  [place]   On: 10/27/2013 17:54   Dg Foot Complete Left  10/27/2013   CLINICAL DATA:  Foot injury with laceration  EXAM: LEFT FOOT - COMPLETE 3+ VIEW  COMPARISON:  None.  FINDINGS: Soft tissue injury with gas in the soft tissues dorsal to the ankle and foot. Soft tissue  injury noted.  Negative for fracture or foreign body. Advanced degenerative change in the first MTP joint.  IMPRESSION: Soft tissue injury with laceration and gas in the tissues. No acute bony abnormality   Electronically Signed   By: Marlan Palau M.D.   On: 10/27/2013 16:40     EKG Interpretation None      MDM   Final diagnoses:  Left cuboid fracture, closed, initial encounter  Foot laceration, left, initial encounter  Crush injury of foot, left, initial encounter   Patient with significant injury to  left foot with significant mechanism. Suspect fracture likely open. Tetanus and IV antibiotics ordered, IV fluids, basic blood work. X-rays pending and plan orthopedic consult. Laceration repair material at bedside.   multiple lacerations repaired in ER by myself. Compartments soft on recheck, mild bleeding from puncture wounds.  patient's pain controlled in the ER. X-ray no acute fracture however clinical suspicion for fracture is mechanism. CT ordered and confirmed cuboid fracture, no laceration near the site of fracture. Discussed the case in detail with Dr. Devonne Doughty reviewed the images and recommended seeing the patient Monday for reassessment. Can do to ordered and crutches from [employer]. Wound care in the ER.   Results and differential diagnosis were discussed with the patient/parent/guardian. Close follow up outpatient was discussed, comfortable with the plan.   Medications  morphine 4 MG/ML injection 4 mg (not administered)  fentaNYL (SUBLIMAZE) injection 50 mcg (50 mcg Intravenous Given 10/27/13 1452)  Tdap (BOOSTRIX) injection 0.5 mL (0.5 mLs Intramuscular Given 10/27/13 1604)  ceFAZolin (ANCEF) IVPB 1 g/50 mL premix (0 g Intravenous Stopped 10/27/13 1806)  lidocaine (PF) (XYLOCAINE) 1 % injection 30 mL (30 mLs Intradermal Given 10/27/13 1700)  0.9 %  sodium chloride infusion ( Intravenous Stopped 10/27/13 1914)    Filed Vitals:   10/27/13 1500 10/27/13 1530 10/27/13 1600 10/27/13 1700  BP: 115/75 105/67 113/67 121/65  Pulse: 88 80 82 79  Temp:      TempSrc:      Resp: Height:      Weight:      SpO2: 96% 98% 98% 100%    Final diagnoses:  Left cuboid fracture, closed, initial encounter  Foot laceration, left, initial encounter  Crush injury of foot, left, initial encounter         Enid Skeens, MD 10/27/13 1916  Health arranged outpatient with dressing changes and care at home. Patient not comfortable going home as he lives alone and unable to do dressing  changes and concern for pain control as well. Patient will be moved C. Pod for ortho and hospitalist page to discuss observation.  Ortho admitted for pain control and further treatment, PT, abx...      Enid Skeens, MD 10/28/13 1124  Enid Skeens, MD 11/05/13 [phone]

## 2013-10-27 NOTE — ED Notes (Signed)
Pt requesting beverage, explained process to patient and provided wet swab for patient.

## 2013-10-27 NOTE — Progress Notes (Signed)
10/27/2013 [facility] 1610RU Patient to be admitted.  EDCM asked EDRN to give phone to patient so that [facility] can speak with him.  Patient reports he lives alone.  Patient has two daughters who are away at college, a brother who lives far away, and one neighbor who is in her 60's per patient.  Patient has been out of work for three months.  Patient confirms he does not have a family doctor.  Patient reports he is having pain in his leg and he "Could not stand the pressure of that brace on my leg when I stand."  Patient also reports, "they have been coming in here almost evey hour to change the dressing it's so bloody."  Patient reports he has had two back surgeries in the past.  Patient has a walker at home, no other home health equipment.  [facility].  [facility] chosen for home health services.  [facility] asked patient if he would be able to afford private duty nursing?  Patient reports he cannot.  [facility] faxed over to [employer] list of home health agencies in [place] highlighting [facility].  EDCM aslo faxed list of pcps who accept Medicare insurance within a 25 mile radius of patient's zip code [address].  [facility] faxed this information to fax number [phone] at 2140pm with confirmation of receipt at 2142pm.  Patient thankful for resources.  EDCM spoke to [facility] at 2250pm who will make sure patient receives resources.  Home health referral not placed at this time, awaiting orders.  No further EDCM needs at this time.

## 2013-10-27 NOTE — ED Notes (Signed)
Ortho at bedside.

## 2013-10-27 NOTE — ED Notes (Signed)
Per EMS pt was cutting down tree and 60 foot trunk landed directly on left foot. Patient used claw hammer to get foot out and drove lawn mower to farm to call for help. Foot has penetrating unknown object. Bleeding controlled with gauze.   Has had   IV morphine And of normal saline

## 2013-10-27 NOTE — Progress Notes (Signed)
  CARE MANAGEMENT ED NOTE 10/27/2013  Patient:  [name], [name]   Account Number:  [id number]  Date Initiated:  10/27/2013  Documentation initiated by:  Radford Pax  Subjective/Objective Assessment:   Patient presents to Ed post large tree trunk falling on patient's foot.  Patient has sustained penetration injury to dorsal side of foot and lacerations to medial aspect.     Subjective/Objective Assessment Detail:     Action/Plan:   Action/Plan Detail:   Anticipated DC Date:       Status Recommendation to Physician:   Result of Recommendation:    Other ED Services  Consult Working Plan    DC Planning Services  Other    Choice offered to / List presented to:            Status of service:  Completed, signed off  ED Comments:   ED Comments Detail:   EDCM consulted to set up home health services for patient in [facility].  As per EDRN patient lives alone.  Cam boot placed to patient's left lower leg.  Per EDRN patient able to ambulate with crutches, however patient refusing to leave ED. Patient feels he is not capable of caring for himself at home. EDRN reports dressing is "bleeding through."  [facility] received home health orders with face to face, however face face order states, "Needs regular dressing changes."  EDCM informed EDRN Lanora Manis that home health will not come to patient's home every day to provide daily  dressing changes. EDRN to discuss patient disposition with EDP. EDCM will contact EDRN again shortly for update.

## 2013-10-28 MED ORDER — CEPHALEXIN 500 MG PO CAPS: 500.0000 mg | ORAL_CAPSULE | Freq: Four times a day (QID) | ORAL | Status: DC

## 2013-10-28 MED ORDER — HYDROCODONE-ACETAMINOPHEN 10-325 MG PO TABS: 1.0000 | ORAL_TABLET | Freq: Four times a day (QID) | ORAL | Status: DC | PRN

## 2013-10-28 NOTE — Evaluation (Signed)
Physical Therapy Evaluation Patient Details Name: [name] MRN: [id number] DOB: 05/17/1945 Today's Date: 10/28/2013   History of Present Illness  Left foot crush injury from tree falling on it  Clinical Impression  Pt admitted with above injury. Pt currently with functional limitations due to the deficits listed below (see PT Problem List).  Pt will benefit from skilled PT to increase their independence and safety with mobility to allow discharge to home. Pt concerned about DC home today due to drainage from foot.      Follow Up Recommendations Home health PT    Equipment Recommendations  None recommended by PT    Recommendations for Other Services       Precautions / Restrictions Precautions Precautions: Fall Restrictions RLE Weight Bearing:  (per MD notes, minimal to PWB on LLE-not RLE)      Mobility  Bed Mobility Overal bed mobility: Modified Independent                Transfers Overall transfer level: Needs assistance Equipment used: Rolling walker (2 wheeled) Transfers: Sit to/from Stand Sit to Stand: Min guard         General transfer comment: cues for hand placement and to not sit until LLE against chair  Ambulation/Gait Ambulation/Gait assistance: Min guard Ambulation Distance (Feet): 15 Feet Assistive device: Rolling walker (2 wheeled) Gait Pattern/deviations: Step-to pattern;Decreased stride length;Antalgic   Gait velocity interpretation: Below normal speed for age/gender General Gait Details: genrally was able to keep most of his weight off of RLE  Stairs Stairs: Yes Stairs assistance: Min assist Stair Management: Backwards;With walker Number of Stairs: 2 General stair comments: instructed pt and family  Wheelchair Mobility    Modified Rankin (Stroke Patients Only)       Balance Overall balance assessment: Needs assistance Sitting-balance support: Feet supported Sitting balance-Leahy Scale: Normal     Standing balance  support: Bilateral upper extremity supported Standing balance-Leahy Scale: Fair                               Pertinent Vitals/Pain Pain Assessment: 0-10 Pain Score: 7  Pain Location: L foot and L big toe Pain Intervention(s): Premedicated before session;Limited activity within patient's tolerance    Home Living Family/patient expects to be discharged to:: Private residence Living Arrangements: Alone (Children may be able to assist a little) Available Help at Discharge: Family Type of Home: House Home Access: Stairs to enter Entrance Stairs-Rails: None Entrance Stairs-Number of Steps: 2 Home Layout: One level Home Equipment: Environmental consultant - 2 wheels      Prior Function Level of Independence: Independent         Comments: was cutting down a tree when accident happened     Hand Dominance        Extremity/Trunk Assessment   Upper Extremity Assessment: Overall WFL for tasks assessed           Lower Extremity Assessment: Overall WFL for tasks assessed         Communication   Communication: No difficulties  Cognition Arousal/Alertness: Awake/alert Behavior During Therapy: WFL for tasks assessed/performed Overall Cognitive Status: Within Functional Limits for tasks assessed                      General Comments General comments (skin integrity, edema, etc.): family present for second half of session. Pt very concerned about changing his dressings on his foot and amount of drainage.  Exercises        Assessment/Plan    PT Assessment Patient needs continued PT services  PT Diagnosis Difficulty walking;Acute pain   PT Problem List Decreased mobility;Decreased balance;Decreased knowledge of use of DME;Pain  PT Treatment Interventions DME instruction;Gait training;Stair training;Patient/family education   PT Goals (Current goals can be found in the Care Plan section) Acute Rehab PT Goals Patient Stated Goal: to have his foot stop  bleeding PT Goal Formulation: With patient Time For Goal Achievement: 11/04/13 Potential to Achieve Goals: Good    Frequency Min 5X/week   Barriers to discharge        Co-evaluation               End of Session Equipment Utilized During Treatment: Gait belt;Other (comment) (cast shoe) Activity Tolerance: Patient tolerated treatment well Patient left: in bed;with call bell/phone within reach;with family/visitor present;Other (comment) (LLE elevated) Nurse Communication: Mobility status;Other (comment) (Concern about drainage)    Functional Assessment Tool Used: clinical judgement Functional Limitation: Mobility: Walking and moving around Mobility: Walking and Moving Around Current Status [phone]): At least 1 percent but less than 20 percent impaired, limited or restricted Mobility: Walking and Moving Around Goal Status [phone]): At least 1 percent but less than 20 percent impaired, limited or restricted    Time: 1035-1119 PT Time Calculation (min): 44 min   Charges:   PT Evaluation $Initial PT Evaluation Tier I: 1 Procedure PT Treatments $Gait Training: 23-37 mins   PT G Codes:   Functional Assessment Tool Used: clinical judgement Functional Limitation: Mobility: Walking and moving around    [employer] 10/28/2013, 11:41 AM Lavona Mound, PT  [phone] 10/28/2013

## 2013-10-28 NOTE — Progress Notes (Signed)
Orthopedic Tech Progress Note Patient Details:  [name] 1945-08-07 [id number]  Ortho Devices Type of Ortho Device: Postop shoe/boot Ortho Device/Splint Location: lle Ortho Device/Splint Interventions: Application   Cammer, Mickie Bail 10/28/2013, 10:13 AM

## 2013-10-28 NOTE — Care Management Note (Signed)
Received call from patient's nurse to confirm whether patient is set up with [facility] [place]. Call to [facility] rep, Judeth Cornfield, who states home health has been arranged. Made Judeth Cornfield aware of discharge today. [facility] to follow up post hospital discharge. No further needs assessed. Gerre Scull Danitza Schoenfeldt,RNCM [phone]

## 2013-10-28 NOTE — Progress Notes (Signed)
    Subjective:     Patient reports pain as 3 on 0-10 scale.   Denies CP or SOB.  Voiding without difficulty. Positive flatus. Objective: Vital signs in last 24 hours: Temp:  [97.6 F (36.4 C)-98.1 F (36.7 C)] 98.1 F (36.7 C) (09/19 0603) Pulse Rate:  [57-92] 75 (09/19 0603) Resp:  [12-21] 20 (09/19 0603) BP: (105-129)/(63-78) 123/77 mmHg (09/19 0603) SpO2:  [92 %-100 %] 99 % (09/19 0603) Weight:  [101.152 kg (223 lb)] 101.152 kg (223 lb) (09/18 1439)  Intake/Output from previous day:   Intake/Output this shift:    Labs:  Recent Labs  10/27/13 1527  HGB 14.9    Recent Labs  10/27/13 1527  WBC 11.3*  RBC 4.84  HCT 43.5  PLT 217    Recent Labs  10/27/13 1527  NA 138  K 4.7  CL 103  CO2 24  BUN 11  CREATININE 0.98  GLUCOSE 146*  CALCIUM 9.4   No results found for this basename: LABPT, INR,  in the last 72 hours  Physical Exam: Neurologically intact ABD soft Intact pulses distally Incision: dressing C/D/I Compartment soft  Assessment/Plan:     Up with therapy PLAN ON D/C TO HOME TODAY PWB F/U Tuesday with Dr Ranell Patrick for wound check  Venita Lick D for Dr. Venita Lick [facility] [phone] 10/28/2013, 9:06 AM

## 2013-10-28 NOTE — Discharge Instructions (Signed)
Keep wounds clean, change dressing regularly. Partial weightbearing. Call and see orthopedics Monday Dr. Ranell Patrick.  If you were given medicines take as directed.  If you are on coumadin or contraceptives realize their levels and effectiveness is altered by many different medicines.  If you have any reaction (rash, tongues swelling, other) to the medicines stop taking and see a physician.   Please follow up as directed and return to the ER or see a physician for new or worsening symptoms.  Thank you. Filed Vitals:   10/27/13 1500 10/27/13 1530 10/27/13 1600 10/27/13 1700  BP: 115/75 105/67 113/67 121/65  Pulse: 88 80 82 79  Temp:      TempSrc:      Resp: Height:      Weight:      SpO2: 96% 98% 98% 100%

## 2013-10-28 NOTE — Progress Notes (Signed)
Utilization Review Completed.Kalle Bernath T9/19/2015  

## 2013-11-02 ENCOUNTER — Other Ambulatory Visit (HOSPITAL_COMMUNITY): Payer: Self-pay | Admitting: Physician Assistant

## 2013-11-02 ENCOUNTER — Encounter (HOSPITAL_COMMUNITY): Payer: Self-pay | Admitting: General Practice

## 2013-11-02 ENCOUNTER — Inpatient Hospital Stay (HOSPITAL_COMMUNITY)
Admission: AD | Admit: 2013-11-02 | Discharge: 2013-11-06 | DRG: 603 | Disposition: A | Discharge status: Home Health | Payer: Medicare Other | Source: Ambulatory Visit | Attending: Orthopedic Surgery | Admitting: Orthopedic Surgery

## 2013-11-02 DIAGNOSIS — L03119 Cellulitis of unspecified part of limb: Principal | ICD-10-CM

## 2013-11-02 DIAGNOSIS — L03116 Cellulitis of left lower limb: Secondary | ICD-10-CM

## 2013-11-02 DIAGNOSIS — L02619 Cutaneous abscess of unspecified foot: Secondary | ICD-10-CM | POA: Diagnosis present

## 2013-11-02 DIAGNOSIS — F17200 Nicotine dependence, unspecified, uncomplicated: Secondary | ICD-10-CM | POA: Diagnosis present

## 2013-11-02 DIAGNOSIS — M79609 Pain in unspecified limb: Secondary | ICD-10-CM | POA: Diagnosis present

## 2013-11-02 HISTORY — DX: Cellulitis of left lower limb: L03.116

## 2013-11-02 HISTORY — DX: Gastro-esophageal reflux disease without esophagitis: K21.9

## 2013-11-02 HISTORY — DX: Other chronic pain: G89.29

## 2013-11-02 HISTORY — DX: Low back pain, unspecified: M54.50

## 2013-11-02 HISTORY — DX: Unspecified osteoarthritis, unspecified site: M19.90

## 2013-11-02 HISTORY — DX: Low back pain: M54.5

## 2013-11-02 LAB — BASIC METABOLIC PANEL
ANION GAP: 15 (ref 5–15)
BUN: 9 mg/dL (ref 6–23)
CALCIUM: 8.9 mg/dL (ref 8.4–10.5)
CO2: 23 mEq/L (ref 19–32)
CREATININE: 0.85 mg/dL (ref 0.50–1.35)
Chloride: 100 mEq/L (ref 96–112)
GFR calc Af Amer: >90 mL/min (ref >90)
GFR calc non Af Amer: 88 mL/min — ABNORMAL LOW (ref >90)
Glucose, Bld: 89 mg/dL (ref 70–99)
Potassium: 5 mEq/L (ref 3.7–5.3)
SODIUM: 138 meq/L (ref 137–147)

## 2013-11-02 LAB — CBC WITH DIFFERENTIAL/PLATELET
BASOS ABS: 0 10*3/uL (ref 0.0–0.1)
BASOS PCT: 0 % (ref 0–1)
EOS PCT: 4 % (ref 0–5)
Eosinophils Absolute: 0.2 10*3/uL (ref 0.0–0.7)
HCT: 38.7 % — ABNORMAL LOW (ref 39.0–52.0)
Hemoglobin: 13.2 g/dL (ref 13.0–17.0)
Lymphocytes Relative: 28 % (ref 12–46)
Lymphs Abs: 1.6 10*3/uL (ref 0.7–4.0)
MCH: 31.1 pg (ref 26.0–34.0)
MCHC: 34.1 g/dL (ref 30.0–36.0)
MCV: 91.3 fL (ref 78.0–100.0)
MONO ABS: 0.6 10*3/uL (ref 0.1–1.0)
Monocytes Relative: 10 % (ref 3–12)
Neutro Abs: 3.4 10*3/uL (ref 1.7–7.7)
Neutrophils Relative %: 58 % (ref 43–77)
Platelets: 255 10*3/uL (ref 150–400)
RBC: 4.24 MIL/uL (ref 4.22–5.81)
RDW: 12.8 % (ref 11.5–15.5)
WBC: 5.9 10*3/uL (ref 4.0–10.5)

## 2013-11-02 MED ORDER — ONDANSETRON HCL 4 MG/2ML IJ SOLN: 4.0000 mg | Freq: Four times a day (QID) | INTRAMUSCULAR | Status: DC | PRN

## 2013-11-02 MED ORDER — PIPERACILLIN-TAZOBACTAM 3.375 G IVPB
3.3750 g | Freq: Three times a day (TID) | INTRAVENOUS | Status: DC
  Administered 2013-11-02 – 2013-11-06 (×12): 3.375 g via INTRAVENOUS
  Filled 2013-11-02 (×13): qty 50

## 2013-11-02 MED ORDER — SODIUM CHLORIDE 0.9 % IV SOLN
INTRAVENOUS | Status: DC
  Administered 2013-11-02 – 2013-11-04 (×3): via INTRAVENOUS

## 2013-11-02 MED ORDER — HYDROCODONE-ACETAMINOPHEN 5-325 MG PO TABS
1.0000 | ORAL_TABLET | ORAL | Status: DC | PRN
  Administered 2013-11-02 – 2013-11-03 (×2): 2 via ORAL
  Administered 2013-11-03: 1 via ORAL
  Administered 2013-11-04 – 2013-11-05 (×3): 2 via ORAL
  Filled 2013-11-02 (×3): qty 2
  Filled 2013-11-02: qty 1
  Filled 2013-11-02 (×2): qty 2

## 2013-11-03 NOTE — Progress Notes (Signed)
Orthopedics Progress Note  Subjective: I do feel better  Objective:  Filed Vitals:   11/03/13 1334  BP: 125/72  Pulse: 74  Temp: 98 F (36.7 C)  Resp: 18    General: Awake and alert  Musculoskeletal: left foot remains swollen with some serous drainage from numerous wounds and some skin maceration around and in between the toes.  There is still erythema concerning for cellulitis Neurovascularly intact  Lab Results  Component Value Date   WBC 5.9 11/02/2013   HGB 13.2 11/02/2013   HCT 38.7* 11/02/2013   MCV 91.3 11/02/2013   PLT 255 11/02/2013       Component Value Date/Time   NA 138 11/02/2013 1544   K 5.0 11/02/2013 1544   CL 100 11/02/2013 1544   CO2 23 11/02/2013 1544   GLUCOSE 89 11/02/2013 1544   BUN 9 11/02/2013 1544   CREATININE 0.85 11/02/2013 1544   CALCIUM 8.9 11/02/2013 1544   GFRNONAA 88* 11/02/2013 1544   GFRAA >90 11/02/2013 1544    Lab Results  Component Value Date   INR 1.4 07/10/2007    Assessment/Plan:  s/p blunt trauma/crush injury to the left foot with active cellulitis Continue daily dry dressing change to the foot with gauze in between the toes to wick moisture Continue IV antibiotics and bed rest with foot elevated, DVT prophylaxis- mechanical Discussed with Dr Victorino Dike who agrees with the plan.  No plans for surgery at this time.  Almedia Balls. Ranell Patrick, MD 11/03/2013 3:57 PM

## 2013-11-03 NOTE — H&P (Signed)
[name] is an 68 y.o. male.  Chief Complaint: Left foot crush injury  HPI: 68 yo male who was cutting a tree down today when the hickory tree that he was cutting slid off and crushed his left foot. Presented with obvious swelling and open injury to the foot. No other complaints. Patient initially sent home on po antibiotics.  Unfortunately the patient could not afford the antibiotics and social work and home health were unable to get the patient on antibiotics.  He returned to [place] twice this past week for skin checks and his swelling was worse on 9-24 with some erythema concerning for cellulitis.  The patient reported increased pain as well.  Admitted for bedrest elevation, wound care and IV antibiotics.  History reviewed. No pertinent past medical history.  Past Surgical History   Procedure  Laterality  Date   .  Back surgery       3 PROCEDURES    No family history on file.  Social History: reports that he has been smoking. He does not have any smokeless tobacco history on file. He reports that he does not drink alcohol or use illicit drugs.  Allergies:  Allergies   Allergen  Reactions   .  Tomato  Itching     (Not in a hospital admission)  Results for orders placed during the hospital encounter of 10/27/13 (from the past 48 hour(s))   CBC Status: Abnormal    Collection Time    10/27/13 3:27 PM   Result  Value  Ref Range    WBC  11.3 (*)  4.0 - 10.5 K/uL    RBC  4.84  4.22 - 5.81 MIL/uL    Hemoglobin  14.9  13.0 - 17.0 g/dL    HCT  43.5  39.0 - 52.0 %    MCV  89.9  78.0 - 100.0 fL    MCH  30.8  26.0 - 34.0 pg    MCHC  34.3  30.0 - 36.0 g/dL    RDW  13.2  11.5 - 15.5 %    Platelets  217  150 - 400 K/uL   BASIC METABOLIC PANEL Status: Abnormal    Collection Time    10/27/13 3:27 PM   Result  Value  Ref Range    Sodium  138  137 - 147 mEq/L    Potassium  4.7  3.7 - 5.3 mEq/L    Chloride  103  96 - 112 mEq/L    CO2  24  19 - 32 mEq/L    Glucose, Bld  146  (*)  70 - 99 mg/dL    BUN  11  6 - 23 mg/dL    Creatinine, Ser  0.98  0.50 - 1.35 mg/dL    Calcium  9.4  8.4 - 10.5 mg/dL    GFR calc non Af Amer  83 (*)  >90 mL/min    GFR calc Af Amer  >90  >90 mL/min    Comment:  (NOTE)     The eGFR has been calculated using the CKD EPI equation.     This calculation has not been validated in all clinical situations.     eGFR's persistently <90 mL/min signify possible Chronic Kidney     Disease.    Anion gap  11  5 - 15    Dg Tibia/fibula Left  10/27/2013 CLINICAL DATA: Lacerations to the left lower leg. A tree fell upon the left lower leg while the patient was  cutting down. Initial encounter. EXAM: LEFT TIBIA AND FIBULA - 2 VIEW COMPARISON: Left knee and left ankle x-rays obtained concurrently. FINDINGS: No acute fractures involving the tibia or fibula. Well preserved bone mineral density. Please see the concurrent reports of the left knee and left ankle imaging. IMPRESSION: No acute fractures involving the tibia or fibula. Electronically Signed By: Evangeline Dakin M.D. On: 10/27/2013 16:47  Dg Ankle Complete Left  10/27/2013 CLINICAL DATA: Lacerations to the left lower leg. A tree fell upon the left lower leg while the patient was cutting down. Initial encounter. EXAM: LEFT ANKLE COMPLETE - 3+ VIEW COMPARISON: Left tibia fibula x-rays obtained concurrently. FINDINGS: Gas within the anterior soft tissues of the lower leg extending into the dorsum of the foot. Dorsal and lateral soft tissue swelling. No evidence of acute fracture or dislocation. Ankle mortise intact. Mild hypertrophic spurring involving the distal tibia at the ankle joint. Well preserved bone mineral density. Atherosclerotic calcification involving the peroneal artery. IMPRESSION: Soft tissue injury. No acute osseous abnormality. Mild osteoarthritis. Electronically Signed By: Evangeline Dakin M.D. On: 10/27/2013 16:42  Ct Foot Left Wo Contrast  10/27/2013 CLINICAL DATA: Status post direct trauma to  the foot from a tree falling on the extremity EXAM: CT OF THE LEFT FOOT WITHOUT CONTRAST TECHNIQUE: Multidetector CT imaging of the left foot was performed according to the standard protocol. Multiplanar CT image reconstructions were also generated. COMPARISON: Foot series of today's date FINDINGS: The soft tissues of the foot reveal diffuse edema as well as gas within the deep musculature of the plantar aspect of the foot as well as between the second and third metatarsals and within the soft tissues over the dorsum of the metatarsal region. There is an acute fracture through the posterior medial aspect of the cuboid. The navicular and cuneiforms are intact. The talus and calcaneus are intact. There is a plantar calcaneal spur. There is a fracture involving the medial and dorsal aspect of the base of the third metatarsal. There is moderate degenerative change of the first metatarsal phalangeal joint. The phalanges are intact. IMPRESSION: 1. There is an acute fracture of the cuboid and of the base of the third metatarsal. 2. There is diffuse soft tissue swelling, and there is soft tissue gas within the midfoot dorsally and within the deep plantar soft tissues. No definite foreign body is demonstrated. The patient's injury is presumed to be acute. If it is subacute, these findings could reflect the presence of severe infection. 3. The phalanges are intact. Electronically Signed By: David Martinique On: 10/27/2013 17:54  Dg Foot Complete Left  10/27/2013 CLINICAL DATA: Foot injury with laceration EXAM: LEFT FOOT - COMPLETE 3+ VIEW COMPARISON: None. FINDINGS: Soft tissue injury with gas in the soft tissues dorsal to the ankle and foot. Soft tissue injury noted. Negative for fracture or foreign body. Advanced degenerative change in the first MTP joint. IMPRESSION: Soft tissue injury with laceration and gas in the tissues. No acute bony abnormality Electronically Signed By: Franchot Gallo M.D. On: 10/27/2013 16:40   ROS   Blood pressure 124/78, pulse 80, temperature 97.6 F (36.4 C), temperature source Oral, resp. rate 21, height _0  (1.88 m), weight 101.152 kg (223 lb), SpO2 100.00%.  Physical Exam AAO, moderate distress, left LE with swollen left foot with multiple lacerations that have been sutured all on the medial side of the foot. There is moderate swelling and bruising on the lateral side of the foot. There is an obvious great toe nail crush injury  as well. Venous bleeding noted from a dorsal puncture wound to the midfoot and from the great toe nailbed.   Assessment/Plan  Left foot crush injury with nondisplaced cuboid fracture and great toe nailbed injury which is decompressed. No evidence for compartment syndrome. Patient has had tetanus booster and IV ancef already.  Due to the fact that the patient lives alone and is unable to care for himself, we will admit him for pain control, dressing changes, PT/mobilization, and D/C planning.  Patient with cellulitis and swelling. IV Zosyn and bedrest for now.  Likely will need to stay through the weekend. Marland Kitchen Angee Gupton,STEVEN R  10/27/2013, 8:57 PM

## 2013-11-04 MED ORDER — DOCUSATE SODIUM 100 MG PO CAPS
100.0000 mg | ORAL_CAPSULE | Freq: Two times a day (BID) | ORAL | Status: DC
  Administered 2013-11-04 – 2013-11-06 (×5): 100 mg via ORAL
  Filled 2013-11-04 (×5): qty 1

## 2013-11-04 MED ORDER — BISACODYL 5 MG PO TBEC
5.0000 mg | DELAYED_RELEASE_TABLET | Freq: Every day | ORAL | Status: DC | PRN
  Administered 2013-11-04 – 2013-11-05 (×2): 5 mg via ORAL
  Filled 2013-11-04 (×2): qty 1

## 2013-11-04 NOTE — Progress Notes (Signed)
Subjective: L foot cellulitis S/p crush injury 9/18, initially D/C'd on PO abx, returned for wound check outpatient 9/24 with erythema, admitted for IV abx.  Reports his pain and swelling are improving Has not had BM since the day he was admitting, asking about stool softener No other c/o  Objective: Vital signs in last 24 hours: Temp:  [98 F (36.7 C)-98.6 F (37 C)] 98.6 F (37 C) (09/26 0454) Pulse Rate:  [74-89] 81 (09/26 0454) Resp:  [16-18] 18 (09/26 0454) BP: (110-125)/(59-72) 111/59 mmHg (09/26 0454) SpO2:  [98 %-100 %] 99 % (09/26 0454)  Intake/Output from previous day: 09/25 0701 - 09/26 0700 In: 2432.5 [P.O.:1200; I.V.:1232.5] Out: 1500 [Urine:1500] Intake/Output this shift:     Recent Labs  11/02/13 1544  HGB 13.2    Recent Labs  11/02/13 1544  WBC 5.9  RBC 4.24  HCT 38.7*  PLT 255    Recent Labs  11/02/13 1544  NA 138  K 5.0  CL 100  CO2 23  BUN 9  CREATININE 0.85  GLUCOSE 89  CALCIUM 8.9   No results found for this basename: LABPT, INR,  in the last 72 hours  Neurologically intact ABD soft Neurovascular intact Sensation intact distally Intact pulses distally Dorsiflexion/Plantar flexion intact Incision: dressing C/D/I and scant drainage Compartment soft no calf pain or sign of DVT  Assessment/Plan: L foot crush injury, cellulitis  Continue IV zosyn, plan to continue through weekend Dressing changed this AM - reapplied zeroform, gauze over dorsal foot and between toes Bedrest, elevation to reduce swelling Added colace BID and dulcolax prn constipation Continue SCD's for DVT ppx  Gertrude Tarbet M. 11/04/2013, 8:18 AM

## 2013-11-05 MED ORDER — POLYETHYLENE GLYCOL 3350 17 G PO PACK
17.0000 g | PACK | Freq: Every day | ORAL | Status: DC
  Administered 2013-11-05 – 2013-11-06 (×2): 17 g via ORAL
  Filled 2013-11-05 (×2): qty 1

## 2013-11-05 MED ORDER — DIPHENHYDRAMINE HCL 25 MG PO CAPS
25.0000 mg | ORAL_CAPSULE | Freq: Four times a day (QID) | ORAL | Status: DC | PRN
  Administered 2013-11-05: 25 mg via ORAL
  Filled 2013-11-05: qty 1

## 2013-11-05 MED ORDER — HYDROCODONE-ACETAMINOPHEN 5-325 MG PO TABS
1.0000 | ORAL_TABLET | ORAL | Status: DC | PRN
  Administered 2013-11-05 – 2013-11-06 (×3): 2 via ORAL
  Filled 2013-11-05 (×3): qty 2

## 2013-11-05 NOTE — Progress Notes (Signed)
[name]  MRN: [id number] DOB/Age: 67-Oct-1947 67 y.o. Physician: Jacquelyne Balint Procedure:       Subjective: Pain as expected, was up a bit in bathroom and pain increased  Vital Signs Temp:  [97.8 F (36.6 C)-98.4 F (36.9 C)] 97.8 F (36.6 C) (09/27 0525) Pulse Rate:  [63-74] 63 (09/27 0525) Resp:  [17-19] 18 (09/27 0525) BP: (109-111)/(54-67) 111/54 mmHg (09/27 0525) SpO2:  [97 %-100 %] 100 % (09/27 0525)  Lab Results  Recent Labs  11/02/13 1544  WBC 5.9  HGB 13.2  HCT 38.7*  PLT 255   BMET  Recent Labs  11/02/13 1544  NA 138  K 5.0  CL 100  CO2 23  GLUCOSE 89  BUN 9  CREATININE 0.85  CALCIUM 8.9   INR  Date Value Ref Range Status  07/10/2007 1.4   Final     Exam Right lower extremity dressings removed Moves toes well, no sign of purulence or fluctuance         Plan Continue current wound care and antibiotics  Juaquin Ludington  11/05/2013, 12:24 PM

## 2013-11-06 MED ORDER — HYDROCODONE-ACETAMINOPHEN 5-325 MG PO TABS: 1.0000 | ORAL_TABLET | ORAL | Status: DC | PRN

## 2013-11-06 MED ORDER — CLINDAMYCIN HCL 300 MG PO CAPS: 600.0000 mg | ORAL_CAPSULE | Freq: Three times a day (TID) | ORAL | Status: DC

## 2013-11-06 MED ORDER — CLINDAMYCIN HCL 300 MG PO CAPS
600.0000 mg | ORAL_CAPSULE | Freq: Three times a day (TID) | ORAL | Status: DC
  Administered 2013-11-06: 600 mg via ORAL
  Filled 2013-11-06: qty 2

## 2013-11-06 NOTE — Progress Notes (Signed)
D/c to home via wheelchair and drove himself daughter not available to drive home. Pain denied breathing regular and unlabored on room air. VSS. Clindamycin tabs were retrieved from inpatient pharmacy and patient demonstrated  understanding via teach back on how to take meds and instructions for home.

## 2013-11-06 NOTE — Discharge Instructions (Signed)
Elevate the left foot at all times. Ok to bear weight through heel with cast shoe/post op shoe  NO SMOKING AT ALL!!!!!!  Daily dry dressing change with petroleum gauze on top of forefoot  Follow up this Thursday with Dr Ranell Patrick [phone]

## 2013-11-06 NOTE — Progress Notes (Signed)
Orthopedics Progress Note  Subjective: I feel better now.  Objective:  Filed Vitals:   11/06/13 0500  BP: 111/72  Pulse: 61  Temp: 97.4 F (36.3 C)  Resp: 16    General: Awake and alert  Musculoskeletal: foot swelling down a lot, no erythema, no drainage, no maceration Neurovascularly intact  Lab Results  Component Value Date   WBC 5.9 11/02/2013   HGB 13.2 11/02/2013   HCT 38.7* 11/02/2013   MCV 91.3 11/02/2013   PLT 255 11/02/2013       Component Value Date/Time   NA 138 11/02/2013 1544   K 5.0 11/02/2013 1544   CL 100 11/02/2013 1544   CO2 23 11/02/2013 1544   GLUCOSE 89 11/02/2013 1544   BUN 9 11/02/2013 1544   CREATININE 0.85 11/02/2013 1544   CALCIUM 8.9 11/02/2013 1544   GFRNONAA 88* 11/02/2013 1544   GFRAA >90 11/02/2013 1544    Lab Results  Component Value Date   INR 1.4 07/10/2007    Assessment/Plan: S/p severe blunt trauma to the left foot, cuboid fracture with resulting cellulitis Cleared for discharge home with appropriate home health support as long as patient has antibiotic prescription (actual medicine) in his hand before he leaves Follow up this Thursday in my office  [phone]  [name]. Ranell Patrick, MD 11/06/2013 7:27 AM

## 2013-11-06 NOTE — Discharge Summary (Signed)
Physician Discharge Summary   Patient ID: [name] MRN: [id number] DOB/AGE: Jun 27, 1945 68 y.o.  Admit date: 11/02/2013 Discharge date: 11/06/2013  Admission Diagnoses:  Active Problems:   Cellulitis of left foot   Discharge Diagnoses:  Same   Surgeries:  on * No surgery found *   Consultants: Social Work  Discharged Condition: Stable  Hospital Course: [name] is an 68 y.o. male who was admitted 11/02/2013 with a chief complaint of left foot pain and swelling, and found to have a diagnosis of left foot severe crush injury with cuboid fracture and cellulitis.  They were brought to the operating room on * No surgery found * and underwent the above named procedures.    The patient had an uncomplicated hospital course and was stable for discharge.  Recent vital signs:  Filed Vitals:   11/06/13 0500  BP: 111/72  Pulse: 61  Temp: 97.4 F (36.3 C)  Resp: 16    Recent laboratory studies:  Results for orders placed during the hospital encounter of 11/02/13  BASIC METABOLIC PANEL      Result Value Ref Range   Sodium 138  137 - 147 mEq/L   Potassium 5.0  3.7 - 5.3 mEq/L   Chloride 100  96 - 112 mEq/L   CO2 23  19 - 32 mEq/L   Glucose, Bld 89  70 - 99 mg/dL   BUN 9  6 - 23 mg/dL   Creatinine, Ser 8.29  0.50 - 1.35 mg/dL   Calcium 8.9  8.4 - 56.2 mg/dL   GFR calc non Af Amer 88 (*) >90 mL/min   GFR calc Af Amer >90  >90 mL/min   Anion gap 15  5 - 15  CBC WITH DIFFERENTIAL      Result Value Ref Range   WBC 5.9  4.0 - 10.5 K/uL   RBC 4.24  4.22 - 5.81 MIL/uL   Hemoglobin 13.2  13.0 - 17.0 g/dL   HCT 13.0 (*) 86.5 - 78.4 %   MCV 91.3  78.0 - 100.0 fL   MCH 31.1  26.0 - 34.0 pg   MCHC 34.1  30.0 - 36.0 g/dL   RDW 69.6  29.5 - 28.4 %   Platelets 255  150 - 400 K/uL   Neutrophils Relative % 58  43 - 77 %   Neutro Abs 3.4  1.7 - 7.7 K/uL   Lymphocytes Relative 28  12 - 46 %   Lymphs Abs 1.6  0.7 - 4.0 K/uL   Monocytes Relative 10  3 - 12 %   Monocytes  Absolute 0.6  0.1 - 1.0 K/uL   Eosinophils Relative 4  0 - 5 %   Eosinophils Absolute 0.2  0.0 - 0.7 K/uL   Basophils Relative 0  0 - 1 %   Basophils Absolute 0.0  0.0 - 0.1 K/uL    Discharge Medications:     Medication List    ASK your doctor about these medications       cephALEXin 500 MG capsule  Commonly known as:  KEFLEX  Take 1 capsule (500 mg total) by mouth 4 (four) times daily.     diphenhydrAMINE 25 MG tablet  Commonly known as:  BENADRYL  Take 50 mg by mouth every 6 (six) hours as needed for itching.     HYDROcodone-acetaminophen 10-325 MG per tablet  Commonly known as:  NORCO  Take 1 tablet by mouth every 6 (six) hours as needed.  Diagnostic Studies: Dg Tibia/fibula Left  10/27/2013   CLINICAL DATA:  Lacerations to the left lower leg. A tree fell upon the left lower leg while the patient was cutting down. Initial encounter.  EXAM: LEFT TIBIA AND FIBULA - 2 VIEW  COMPARISON:  Left knee and left ankle x-rays obtained concurrently.  FINDINGS: No acute fractures involving the tibia or fibula. Well preserved bone mineral density. Please see the concurrent reports of the left knee and left ankle imaging.  IMPRESSION: No acute fractures involving the tibia or fibula.   Electronically Signed   By: Hulan Saas M.D.   On: 10/27/2013 16:47   Dg Ankle Complete Left  10/27/2013   CLINICAL DATA:  Lacerations to the left lower leg. A tree fell upon the left lower leg while the patient was cutting down. Initial encounter.  EXAM: LEFT ANKLE COMPLETE - 3+ VIEW  COMPARISON:  Left tibia fibula x-rays obtained concurrently.  FINDINGS: Gas within the anterior soft tissues of the lower leg extending into the dorsum of the foot. Dorsal and lateral soft tissue swelling. No evidence of acute fracture or dislocation. Ankle mortise intact. Mild hypertrophic spurring involving the distal tibia at the ankle joint. Well preserved bone mineral density. Atherosclerotic calcification involving  the peroneal artery.  IMPRESSION: Soft tissue injury. No acute osseous abnormality. Mild osteoarthritis.   Electronically Signed   By: Hulan Saas M.D.   On: 10/27/2013 16:42   Ct Foot Left Wo Contrast  10/27/2013   CLINICAL DATA:  Status post direct trauma to the foot from a tree falling on the extremity  EXAM: CT OF THE LEFT FOOT WITHOUT CONTRAST  TECHNIQUE: Multidetector CT imaging of the left foot was performed according to the standard protocol. Multiplanar CT image reconstructions were also generated.  COMPARISON:  Foot series of today's date  FINDINGS: The soft tissues of the foot reveal diffuse edema as well as gas within the deep musculature of the plantar aspect of the foot as well as between the second and third metatarsals and within the soft tissues over the dorsum of the metatarsal region.  There is an acute fracture through the posterior medial aspect of the cuboid. The navicular and cuneiforms are intact. The talus and calcaneus are intact. There is a plantar calcaneal spur. There is a fracture involving the medial and dorsal aspect of the base of the third metatarsal. There is moderate degenerative change of the first metatarsal phalangeal joint. The phalanges are intact.  IMPRESSION: 1. There is an acute fracture of the cuboid and of the base of the third metatarsal. 2. There is diffuse soft tissue swelling, and there is soft tissue gas within the midfoot dorsally and within the deep plantar soft tissues. No definite foreign body is demonstrated. The patient's injury is presumed to be acute. If it is subacute, these findings could reflect the presence of severe infection. 3. The phalanges are intact.   Electronically Signed   By: David  [place]   On: 10/27/2013 17:54   Dg Foot Complete Left  10/27/2013   CLINICAL DATA:  Foot injury with laceration  EXAM: LEFT FOOT - COMPLETE 3+ VIEW  COMPARISON:  None.  FINDINGS: Soft tissue injury with gas in the soft tissues dorsal to the ankle and  foot. Soft tissue injury noted.  Negative for fracture or foreign body. Advanced degenerative change in the first MTP joint.  IMPRESSION: Soft tissue injury with laceration and gas in the tissues. No acute bony abnormality   Electronically Signed  By: Marlan Palau M.D.   On: 10/27/2013 16:40    Disposition: 01-Home or Self Care        Follow-up Information   Follow up with Dorcus Riga,STEVEN R, MD. Call in 2 days. [phone])    Specialty:  Orthopedic Surgery   Contact information:   [address] [place] [address] [phone]        Signed: Verlee Rossetti 11/06/2013, 7:32 AM

## 2013-11-06 NOTE — Care Management Note (Signed)
  Page 1 of 1   11/06/2013     10:12:05 AM CARE MANAGEMENT NOTE 11/06/2013  Patient:  [name], [name]   Account Number:  [id number]  Date Initiated:  11/06/2013  Documentation initiated by:  Ronny Flurry  Subjective/Objective Assessment:     Action/Plan:   Anticipated DC Date:  11/06/2013   Anticipated DC Plan:  HOME W HOME HEALTH SERVICES         Choice offered to / List presented to:  C-1 Patient           HH agency  [facility]   Status of service:   Medicare Important Message given?  YES (If response is "NO", the following Medicare IM given date fields will be blank) Date Medicare IM given:  11/06/2013 Medicare IM given by:  Ronny Flurry Date Additional Medicare IM given:   Additional Medicare IM given by:    Discharge Disposition:    Per UR Regulation:    If discussed at Long Length of Stay Meetings, dates discussed:    Comments:  11-06-13 Received permission from [employer] ( Pharmacy ) and Nicolasa Ducking ( case Management ) that main pharmacy will fill patient's antibiotic in full free of cost to patient , before discharge today .  However, not pain medicine and patient must agree to sign up for Medicare part D . Explained to patient and he is in agreement.  Jimmie Molly with [facility] aware patient is being discharged today . Patient is active with [facility] . Ronny Flurry RN BSN [phone]   11-06-13 Medicare number given to patient . Ronny Flurry RN BSN

## 2014-01-31 NOTE — Discharge Summary (Signed)
Patient ID: [name] MRN: [id number] DOB/AGE: 67/08/1945 67 y.o.  Admit date: 10/27/2013 Discharge date: 01/31/2014  Admission Diagnoses:  Active Problems:   Cuboid fracture   Discharge Diagnoses:  Active Problems:   Cuboid fracture  status post   Past Medical History  Diagnosis Date  . GERD (gastroesophageal reflux disease)   . Cellulitis of foot, left 11/02/2013  . Arthritis     "all over"  . Chronic lower back pain     Surgeries:  on * No surgery found *   Consultants:    Discharged Condition: Improved  Hospital Course: [name] is an 68 y.o. male who was admitted 10/27/2013 for operative treatment of <principal problem not specified>. Patient failed conservative treatments (please see the history and physical for the specifics) and had severe unremitting pain that affects sleep, daily activities and work/hobbies. After pre-op clearance, the patient was taken to the operating room on * No surgery found * and underwent  .    Patient was given perioperative antibiotics:  Anti-infectives    Start     Dose/Rate Route Frequency Ordered Stop   10/28/13 0000  cephALEXin (KEFLEX) 500 MG capsule  Status:  Discontinued     500 mg Oral 4 times daily 10/28/13 0906 11/06/13    10/27/13 2230  ceFAZolin (ANCEF) IVPB 2 g/50 mL premix     2 g100 mL/hr over 30 Minutes Intravenous Every 6 hours 10/27/13 2223 10/28/13 1034   10/27/13 1530  ceFAZolin (ANCEF) IVPB 1 g/50 mL premix     1 g100 mL/hr over 30 Minutes Intravenous  Once 10/27/13 1527 10/27/13 1806   10/27/13 0000  cephALEXin (KEFLEX) 500 MG capsule  Status:  Discontinued     500 mg Oral 3 times daily 10/27/13 1911 11/02/13        Patient was given sequential compression devices and early ambulation to prevent DVT.   Patient benefited maximally from hospital stay and there were no complications. At the time of discharge, the patient was urinating/moving their bowels without difficulty, tolerating a regular  diet, pain is controlled with oral pain medications and they have been cleared by PT/OT.   Recent vital signs: No data found.    Recent laboratory studies: No results for input(s): WBC, HGB, HCT, PLT, NA, K, CL, CO2, BUN, CREATININE, GLUCOSE, INR, CALCIUM in the last 72 hours.  Invalid input(s): PT, 2   Discharge Medications:     Medication List    STOP taking these medications        diphenhydrAMINE 25 MG tablet  Commonly known as:  BENADRYL     famotidine 20 MG tablet  Commonly known as:  PEPCID        Diagnostic Studies: No results found.      Discharge Instructions    Face-to-face encounter (required for Medicare/Medicaid patients)    Complete by:  As directed   I [name], [name] certify that this patient is under my care and that I, or a nurse practitioner or physician's assistant working with me, had a face-to-face encounter that meets the physician face-to-face encounter requirements with this patient on 10/27/2013. The encounter with the patient was in whole, or in part for the following medical condition(s) which is the primary reason for home health care (List medical condition): see above, non weight bearing, laceration care, fracture, ortho fup, future orders per ortho [name]  The encounter with the patient was in whole, or in part, for the  following medical condition, which is the primary reason for home health care:  lacerations and foot fracture, non weight bearing, needs regular dressing changes and see ortho on Monday morning  I certify that, based on my findings, the following services are medically necessary home health services:  Physical therapy  Reason for Medically Necessary Home Health Services:  Other See Comments  My clinical findings support the need for the above services:  OTHER SEE COMMENTS  Further, I certify that my clinical findings support that this patient is homebound due to:  Unable to leave home safely without assistance     Home  Health    Complete by:  As directed   To provide the following care/treatments:   RNPT             Follow-up Information    Follow up with Jonathon R, MD. Call in 2 days.   Specialty:  Orthopedic Surgery   Contact information:   [name] [place] [address] [phone]       Follow up On 10/31/2013.   Why:  For wound re-check      Follow up with [facility].   Why:  home health services   Contact information:   [address] [place] [address] [phone]       Discharge Plan:  discharge to home  Disposition: stable doing well.    Signed: Venita LickBROOKS,[name] for Jonathon. Venita Lickahari [name] [facility] [phone] 01/31/2014, 8:35 AM

## 2014-04-11 ENCOUNTER — Emergency Department (HOSPITAL_COMMUNITY): Payer: Medicare Other

## 2014-04-11 ENCOUNTER — Emergency Department (HOSPITAL_COMMUNITY)
Admission: EM | Admit: 2014-04-11 | Discharge: 2014-04-11 | Disposition: A | Discharge status: Home / Self Care | Payer: Medicare Other | Attending: Emergency Medicine | Admitting: Emergency Medicine

## 2014-04-11 ENCOUNTER — Encounter (HOSPITAL_COMMUNITY): Payer: Self-pay | Admitting: Emergency Medicine

## 2014-04-11 DIAGNOSIS — S66124A Laceration of flexor muscle, fascia and tendon of right ring finger at wrist and hand level, initial encounter: Secondary | ICD-10-CM | POA: Diagnosis not present

## 2014-04-11 DIAGNOSIS — S61212A Laceration without foreign body of right middle finger without damage to nail, initial encounter: Secondary | ICD-10-CM | POA: Diagnosis not present

## 2014-04-11 DIAGNOSIS — Y998 Other external cause status: Secondary | ICD-10-CM | POA: Diagnosis not present

## 2014-04-11 DIAGNOSIS — W293XXA Contact with powered garden and outdoor hand tools and machinery, initial encounter: Secondary | ICD-10-CM | POA: Diagnosis not present

## 2014-04-11 DIAGNOSIS — S66322A Laceration of extensor muscle, fascia and tendon of right middle finger at wrist and hand level, initial encounter: Secondary | ICD-10-CM | POA: Diagnosis not present

## 2014-04-11 DIAGNOSIS — Z720 Tobacco use: Secondary | ICD-10-CM | POA: Diagnosis not present

## 2014-04-11 DIAGNOSIS — Z23 Encounter for immunization: Secondary | ICD-10-CM | POA: Insufficient documentation

## 2014-04-11 DIAGNOSIS — M1990 Unspecified osteoarthritis, unspecified site: Secondary | ICD-10-CM | POA: Insufficient documentation

## 2014-04-11 DIAGNOSIS — G8929 Other chronic pain: Secondary | ICD-10-CM | POA: Diagnosis not present

## 2014-04-11 DIAGNOSIS — Y9389 Activity, other specified: Secondary | ICD-10-CM | POA: Diagnosis not present

## 2014-04-11 DIAGNOSIS — Z872 Personal history of diseases of the skin and subcutaneous tissue: Secondary | ICD-10-CM | POA: Diagnosis not present

## 2014-04-11 DIAGNOSIS — S66126A Laceration of flexor muscle, fascia and tendon of right little finger at wrist and hand level, initial encounter: Secondary | ICD-10-CM | POA: Diagnosis not present

## 2014-04-11 DIAGNOSIS — Z792 Long term (current) use of antibiotics: Secondary | ICD-10-CM | POA: Diagnosis not present

## 2014-04-11 DIAGNOSIS — Y92009 Unspecified place in unspecified non-institutional (private) residence as the place of occurrence of the external cause: Secondary | ICD-10-CM | POA: Insufficient documentation

## 2014-04-11 DIAGNOSIS — Z8719 Personal history of other diseases of the digestive system: Secondary | ICD-10-CM | POA: Diagnosis not present

## 2014-04-11 DIAGNOSIS — Z79899 Other long term (current) drug therapy: Secondary | ICD-10-CM | POA: Diagnosis not present

## 2014-04-11 DIAGNOSIS — S61219A Laceration without foreign body of unspecified finger without damage to nail, initial encounter: Secondary | ICD-10-CM

## 2014-04-11 LAB — BASIC METABOLIC PANEL
Anion gap: 6 (ref 5–15)
BUN: 13 mg/dL (ref 6–23)
CO2: 28 mmol/L (ref 19–32)
Calcium: 8.9 mg/dL (ref 8.4–10.5)
Chloride: 105 mmol/L (ref 96–112)
Creatinine, Ser: 1.06 mg/dL (ref 0.50–1.35)
GFR, EST AFRICAN AMERICAN: 81 mL/min — AB (ref >90)
GFR, EST NON AFRICAN AMERICAN: 70 mL/min — AB (ref >90)
Glucose, Bld: 118 mg/dL — ABNORMAL HIGH (ref 70–99)
Potassium: 3.9 mmol/L (ref 3.5–5.1)
SODIUM: 139 mmol/L (ref 135–145)

## 2014-04-11 LAB — CBC WITH DIFFERENTIAL/PLATELET
Basophils Absolute: 0 10*3/uL (ref 0.0–0.1)
Basophils Relative: 1 % (ref 0–1)
Eosinophils Absolute: 0.5 10*3/uL (ref 0.0–0.7)
Eosinophils Relative: 11 % — ABNORMAL HIGH (ref 0–5)
HCT: 41.1 % (ref 39.0–52.0)
Hemoglobin: 13.8 g/dL (ref 13.0–17.0)
Lymphocytes Relative: 36 % (ref 12–46)
Lymphs Abs: 1.6 10*3/uL (ref 0.7–4.0)
MCH: 30.1 pg (ref 26.0–34.0)
MCHC: 33.6 g/dL (ref 30.0–36.0)
MCV: 89.5 fL (ref 78.0–100.0)
Monocytes Absolute: 0.5 10*3/uL (ref 0.1–1.0)
Monocytes Relative: 11 % (ref 3–12)
NEUTROS ABS: 1.8 10*3/uL (ref 1.7–7.7)
Neutrophils Relative %: 41 % — ABNORMAL LOW (ref 43–77)
PLATELETS: 208 10*3/uL (ref 150–400)
RBC: 4.59 MIL/uL (ref 4.22–5.81)
RDW: 12.9 % (ref 11.5–15.5)
WBC: 4.4 10*3/uL (ref 4.0–10.5)

## 2014-04-11 MED ORDER — MORPHINE SULFATE 4 MG/ML IJ SOLN
4.0000 mg | Freq: Once | INTRAMUSCULAR | Status: AC
  Administered 2014-04-11: 4 mg via INTRAVENOUS
  Filled 2014-04-11: qty 1

## 2014-04-11 MED ORDER — CEFAZOLIN SODIUM 1 G IJ SOLR
1.0000 g | Freq: Once | INTRAMUSCULAR | Status: AC
  Administered 2014-04-11: 1 g via INTRAMUSCULAR
  Filled 2014-04-11: qty 10

## 2014-04-11 MED ORDER — SODIUM CHLORIDE 0.9 % IV BOLUS (SEPSIS)
500.0000 mL | Freq: Once | INTRAVENOUS | Status: AC
  Administered 2014-04-11: 500 mL via INTRAVENOUS

## 2014-04-11 MED ORDER — SODIUM CHLORIDE 0.9 % IV SOLN: INTRAVENOUS | Status: DC

## 2014-04-11 MED ORDER — TETANUS-DIPHTH-ACELL PERTUSSIS 5-2.5-18.5 LF-MCG/0.5 IM SUSP
0.5000 mL | Freq: Once | INTRAMUSCULAR | Status: AC
  Administered 2014-04-11: 0.5 mL via INTRAMUSCULAR
  Filled 2014-04-11: qty 0.5

## 2014-04-11 MED ORDER — POVIDONE-IODINE 10 % EX SOLN
CUTANEOUS | Status: AC
  Administered 2014-04-11: 15:00:00
  Filled 2014-04-11: qty 118

## 2014-04-11 MED ORDER — CEPHALEXIN 500 MG PO CAPS: 500.0000 mg | ORAL_CAPSULE | Freq: Four times a day (QID) | ORAL | Status: DC

## 2014-04-11 MED ORDER — LIDOCAINE HCL (PF) 2 % IJ SOLN
INTRAMUSCULAR | Status: AC
  Administered 2014-04-11: 10 mL
  Filled 2014-04-11: qty 10

## 2014-04-11 MED ORDER — HYDROCODONE-ACETAMINOPHEN 5-325 MG PO TABS
ORAL_TABLET | ORAL | Status: DC
Start: 2014-04-11 — End: 2015-03-12

## 2014-04-11 NOTE — ED Provider Notes (Signed)
CSN: [id number]     Arrival date & time 04/11/14  1125 History   First MD Initiated Contact with Patient 04/11/14 1310     Chief Complaint  Patient presents with  . Laceration      HPI Pt was seen at 1305. Per pt, c/o sudden onset and persistence of constant right fingers laceration that occurred PTA. Pt states he was working with a skill saw cutting wood at home, and it "kicked back," lacerating the back of his 3rd, 4th and 5th fingers of his right hand. Pt is right handed. Denies any other injuries. Last PO intake approximately 0400.    Past Medical History  Diagnosis Date  . GERD (gastroesophageal reflux disease)   . Cellulitis of foot, left 11/02/2013  . Arthritis     "all over"  . Chronic lower back pain    Past Surgical History  Procedure Laterality Date  . Back surgery  X 3  . Posterior fusion lumbar spine  09/2004; 04/2008;   . Lumbar laminectomy Right 08/2009    L2-3  . Synovial cyst excision  08/2009  . Laceration repair Left 1980's    "cut my leg w/chainsaw"   Family History  Problem Relation Age of Onset  . Osteoarthritis Father   . Heart failure Father   . Osteoarthritis Other    History  Substance Use Topics  . 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 Smokeless tobacco: Never Used  . Alcohol Use: No     Comment: "I used to be a drinker; haven't had a drink since ~ 2000"    Review of Systems ROS: Statement: All systems negative except as marked or noted in the HPI; Constitutional: Negative for fever and chills. ; ; Eyes: Negative for eye pain, redness and discharge. ; ; ENMT: Negative for ear pain, hoarseness, nasal congestion, sinus pressure and sore throat. ; ; Cardiovascular: Negative for chest pain, palpitations, diaphoresis, dyspnea and peripheral edema. ; ; Respiratory: Negative for cough, wheezing and stridor. ; ; Gastrointestinal: Negative for nausea, vomiting, diarrhea, abdominal pain, blood in  stool, hematemesis, jaundice and rectal bleeding. . ; ; Genitourinary: Negative for dysuria, flank pain and hematuria. ; ; Musculoskeletal: Negative for back pain and neck pain. Negative for swelling and trauma.; ; Skin: +lacerations. Negative for pruritus, rash, abrasions, blisters, bruising and skin lesion.; ; Neuro: Negative for headache, lightheadedness and neck stiffness. Negative for weakness, altered level of consciousness , altered mental status, extremity weakness, paresthesias, involuntary movement, seizure and syncope.      Allergies  Tomato  Home Medications   Prior to Admission medications   Medication Sig Start Date End Date Taking? Authorizing Provider  clindamycin (CLEOCIN) 300 MG capsule Take 2 capsules (600 mg total) by mouth every 8 (eight) hours. Patient not taking: Reported on 04/11/2014 11/06/13   Verlee RossettiSteven R Norris, MD  HYDROcodone-acetaminophen (NORCO/VICODIN) 5-325 MG per tablet Take 1-2 tablets by mouth every 3 (three) hours as needed for moderate pain. Patient not taking: Reported on 04/11/2014 11/06/13   Verlee RossettiSteven R Norris, MD   BP 135/79 mmHg  Pulse 73  Temp(Src) 98.5 F (36.9 C) (Oral)  Resp 18  Ht 6\' 2"  (1.88 m)  Wt 220 lb (99.791 kg)  BMI 28.23 kg/m2  SpO2 98% Physical Exam  1310: Physical examination:  Nursing notes reviewed; Vital signs and O2 SAT reviewed;  Constitutional: Well developed, Well nourished, Well hydrated, In no acute distress; Head:  Normocephalic, atraumatic; Eyes: EOMI, PERRL, No scleral icterus; ENMT: Mouth and pharynx normal, Mucous membranes moist; Neck: Supple, Full range of motion, No lymphadenopathy; Cardiovascular: Regular rate and rhythm, No gallop; Respiratory: Breath sounds clear & equal bilaterally, No wheezes.  Speaking full sentences with ease, Normal respiratory effort/excursion; Chest: Nontender, Movement normal; Abdomen: Soft, Nontender, Nondistended, Normal bowel sounds; Genitourinary: No CVA tenderness; Extremities: Pulses normal, No  deformity. No edema. +laceration to right proximal dorsal 3rd finger, no apparent tendon involvement, can fully extend 3rd finger. +deeper lacerations to right dorsal 4th and 5th proximal finger/MCP areas:  4th and 5th fingers held in flexion, tendon sheath lacerated and pt can partially extend 4th finger; unable to extend 5th finger, 5th finger with visualized lacerated extensor tendon as well as MCP joint capsule. Fingertips with brisk cap refill, warm/dry/good color.; Neuro: AA&Ox3, Major CN grossly intact.  Speech clear. No gross focal motor or sensory deficits in extremities.; Skin: Color normal, Warm, Dry.   ED Course  Procedures     EKG Interpretation None      MDM  MDM Reviewed: previous chart, nursing note and vitals Interpretation: labs and x-ray   Results for orders placed or performed during the hospital encounter of 04/11/14  Basic metabolic panel  Result Value Ref Range   Sodium 139 135 - 145 mmol/L   Potassium 3.9 3.5 - 5.1 mmol/L   Chloride 105 96 - 112 mmol/L   CO2 28 19 - 32 mmol/L   Glucose, Bld 118 (H) 70 - 99 mg/dL   BUN 13 6 - 23 mg/dL   Creatinine, Ser 8.11 0.50 - 1.35 mg/dL   Calcium 8.9 8.4 - 91.4 mg/dL   GFR calc non Af Amer 70 (L) >90 mL/min   GFR calc Af Amer 81 (L) >90 mL/min   Anion gap 6 5 - 15  CBC with Differential/Platelet  Result Value Ref Range   WBC 4.4 4.0 - 10.5 K/uL   RBC 4.59 4.22 - 5.81 MIL/uL   Hemoglobin 13.8 13.0 - 17.0 g/dL   HCT 78.2 95.6 - 21.3 %   MCV 89.5 78.0 - 100.0 fL   MCH 30.1 26.0 - 34.0 pg   MCHC 33.6 30.0 - 36.0 g/dL   RDW 08.6 57.8 - 46.9 %   Platelets 208 150 - 400 K/uL   Neutrophils Relative % 41 (L) 43 - 77 %   Neutro Abs 1.8 1.7 - 7.7 K/uL   Lymphocytes Relative 36 12 - 46 %   Lymphs Abs 1.6 0.7 - 4.0 K/uL   Monocytes Relative 11 3 - 12 %   Monocytes Absolute 0.5 0.1 - 1.0 K/uL   Eosinophils Relative 11 (H) 0 - 5 %   Eosinophils Absolute 0.5 0.0 - 0.7 K/uL   Basophils Relative 1 0 - 1 %   Basophils  Absolute 0.0 0.0 - 0.1 K/uL   Dg Hand Complete Right 04/11/2014   CLINICAL DATA:  Laceration, skill saw injury  EXAM: RIGHT HAND - COMPLETE 3+ VIEW  COMPARISON:  None.  FINDINGS: Three views of right hand submitted. No acute fracture or subluxation. There is probable soft tissue injury dorsal aspect of of proximal fourth and fifth digit. Degenerative changes are noted first carpometacarpal joint. Narrowing of radiocarpal joint space.  IMPRESSION: No acute fracture or subluxation. Probable soft tissue injury dorsal aspect of proximal fourth and fifth finger. Degenerative changes first carpometacarpal joint.   Electronically Signed   By: Natasha Mead M.D.   On: 04/11/2014 13:06  1420:  Td updated. Ancef given. T/C to [facility] Ortho Dr. Romeo Apple, case discussed, including:  HPI, pertinent PM/SHx, VS/PE, dx testing, ED course and treatment:  Requests to call [facility]geon.  T/C to [facility] for Dr. Mina Marble, case discussed, including:  HPI, pertinent PM/SHx, VS/PE, dx testing, ED course and treatment:  States Dr. Mina Marble has multiple OR cases today, he requests to suture loosely closed, splint/DSD, rx keflex, and have pt f/u in office tomorrow morning at 1030am.   1525:  Midlevel has closed lacerations (see separate note). Dx and testing, as well as d/w Hand Surgery, d/w pt.  Questions answered.  Verb understanding, agreeable to d/c home with outpt f/u in office tomorrow morning at 1030am.   Samuel Jester, DO 04/13/14 2104

## 2014-04-11 NOTE — ED Notes (Signed)
Pt was using a saw cutting wood. The saw kicked back hitting his R hand. Mult deep lacerations noted. Pressure bandage applied.

## 2014-04-11 NOTE — ED Provider Notes (Signed)
LACERATION REPAIRS OF THE RIGHT HAND  Patient is a 69 year old male who presents to the emergency department with multiple lacerations of the right hand following an accident with a chain saw.  Patient identified by arm band. The procedure was explained to the patient in terms which he understands. Patient gives verbal permission for the procedures.  X-ray of the right hand reviewed by me, no acute fracture appreciated. No foreign body appreciated.  The hand was cleansed with saline, it was then painted with Betadine. Using sterile technique, the first laceration on the right third finger was infiltrated with 2% plain lidocaine. The wound was inspected, there was no bone or tendon involvement. The patient was then draped in the usual fashion. The shallow wound was repaired with 2 interrupted sutures of 4-0 nylon. The wound measures 1.3 cm.  The second wound of the fourth finger at the MP joint area was irrigated with saline. No foreign body appreciated. No bone involvement. The tendon sheath is exposed, but the tendon itself appears to be intact. The wound was loosely closed with 3 interrupted sutures of 4-0 nylon. The wound measures 2.8 cm.  The third wound of the fifth finger at the MP joint area was irrigated with saline. There is an extensor tendon laceration noted. There also appears to be injury to the joint capsule of the fifth MP area. The area was irrigated a second time, and then loosely repaired with 3 interrupted sutures of 4-0 nylon. The wound measures 3.7 cm.  Patient tolerated the procedure well without any problem. Bulky dressing and splint applied to the right hand.  Kathie DikeHobson M Esteban Kobashigawa, PA-C 04/11/14 1531  Samuel JesterKathleen McManus, DO 04/13/14 2105

## 2014-04-11 NOTE — Discharge Instructions (Signed)
°Emergency Department Resource Guide °1) Find a Doctor and Pay Out of Pocket °Although you won't have to find out who is covered by your insurance plan, it is a good idea to ask around and get recommendations. You will then need to call the office and see if the doctor you have chosen will accept you as a new patient and what types of options they offer for patients who are self-pay. Some doctors offer discounts or will set up payment plans for their patients who do not have insurance, but you will need to ask so you aren't surprised when you get to your appointment. ° °2) Contact Your Local Health Department °Not all health departments have doctors that can see patients for sick visits, but many do, so it is worth a call to see if yours does. If you don't know where your local health department is, you can check in your phone book. The CDC also has a tool to help you locate your state's health department, and many state websites also have listings of all of their local health departments. ° °3) Find a Walk-in Clinic °If your illness is not likely to be very severe or complicated, you may want to try a walk in clinic. These are popping up all over the country in pharmacies, drugstores, and shopping centers. They're usually staffed by nurse practitioners or physician assistants that have been trained to treat common illnesses and complaints. They're usually fairly quick and inexpensive. However, if you have serious medical issues or chronic medical problems, these are probably not your best option. ° °No Primary Care Doctor: °- Call Health Connect at  832-8000 - they can help you locate a primary care doctor that  accepts your insurance, provides certain services, etc. °- Physician Referral Service- 1-800-533-3463 ° °Chronic Pain Problems: °Organization         Address  Phone   Notes  °[facility]  (336) 297-2271 Patients need to be referred by their primary care doctor.  ° °Medication  Assistance: °Organization         Address  Phone   Notes  °[place] [facility] [address] °[place], [place] [address] (336) 641-8030 --Must be a resident of [place] °-- Must have NO insurance coverage whatsoever (no Medicaid/ Medicare, etc.) °-- The pt. MUST have a primary care doctor that directs their care regularly and follows them in the community °  °[facility]  (866) 331-1348   °[employer]  (888) 892-1162   ° °Agencies that provide inexpensive medical care: °Organization         Address  Phone   Notes  °[facility]  (336) 832-8035   °[facility]    (336) 832-7272   °[facility] [address] °[place], [place] [address] (336) 832-4777   °[facility] [address], °[place] (336) 271-4999   °[facility]    (336) 373-0678   °[facility]    (336) 272-1050   °[facility] ° [address], [place] Phone:  (336) 832-4444, Fax:  (336) 832-4440 Hours of Operation:  9 am - 6 pm, M-F.  Also accepts Medicaid/Medicare and self-pay.  °[facility] ° [address], [place] Phone: (336) 832-3150, Fax: (336) 832-3151. Hours of Operation:  8:30 am - 5:30 pm, M-F.  Also accepts Medicaid and self-pay.  °[facility] [place] [address], [place] Phone: (336) 878-6027   °[facility] [address], [place], [place] (336)723-1848, Ext. 123 Mondays & Thursdays: 7-9 AM.  First 15 patients are seen on a first come, first serve basis. °  ° °Medicaid-accepting [place] Providers: ° °Organization         Address  Phone   Notes  °[facility] [address], [place] (336) 641-2100 Also accepts self-pay patients.  °[facility] [address], [place] ° (336) 856-9996   °[facility] [address], [place]  (336) 288-8857   °[facility] [address] [place] [address], [place] (336) 299-7000   °Veita Bland [address], [place]  ° (336) 373-1557 Only accepts [place] Access Medicaid patients after they have their name applied to their card.  ° °Self-Pay (no insurance) in [place]: ° °Organization         Address  Phone   Notes  °Sickle Cell Patients, [facility] [address], [place] (336) 832-1970   °[facility] [address], [place] (336) 832-4400   °[facility] ° [address], [place] (336) 992-4800   °[facility]/Dr. Osei-Bonsu ° [address] [place] [address], [place] or [address], [place] (336) 841-8500 Phone number for both [place] and [place] locations is the same.  °[facility] [address], [place] (336) 299-0000   °[facility] [address] [place] [address], [place] or [address] (336) 852-7530 °(336) 878-2260   °[facility] [address], [place] (336) 350-1642, phone; (336) 294-5005, fax Sees patients 1st and 3rd Saturday of every month.  Must not qualify for public or private insurance (i.e. Medicaid, Medicare, [place] Health Choice, Veterans' Benefits) • Household income should be no more than 200% of the poverty level •The clinic cannot treat you if you are pregnant or think you are pregnant • Sexually transmitted diseases are not treated at the clinic.  ° ° °Dental Care: °Organization         Address  Phone  Notes  °[place] [facility] [address], [place] (336) 641-6152 Accepts children up to age 21 who are enrolled in Medicaid or [place] Health Choice; pregnant women with a Medicaid card; and children who have applied for Medicaid or [place] Health Choice, but were declined, whose parents can pay a reduced fee at time of service.  °[place]  [facility] [place]  [address], [place] (336) 641-7733 Accepts children up to age 21 who are enrolled in Medicaid or [place] Health Choice; pregnant women with a Medicaid card; and children who have applied for Medicaid or [place] Health Choice, but were declined, whose parents can pay a reduced fee at time of service.  °[facility] ° [address], [place] (336) 641-4533 Patients are seen by appointment only. Walk-ins are not accepted. [facility] will see patients 18 years of age and older. °Monday - Tuesday (8am-5pm) °Most Wednesdays (8:30-5pm) °$30 per visit, cash only  °[facility] ° [address], [place] (336) 641-4533 Patients are seen by appointment only. Walk-ins are not accepted. [facility] will see patients 18 years of age and older. °One   Wednesday Evening (Monthly: Volunteer Based).  $30 per visit, cash only  °[facility]  (919) 537-3737 for adults; Children under age 4, call Graduate Pediatric Dentistry at (919) [phone]. Children aged 4-14, please call (919) [phone] to request a pediatric application. ° Dental services are provided in all areas of dental care including fillings, crowns and bridges, complete and partial dentures, implants, gum treatment, root canals, and extractions. Preventive care is also provided. Treatment is provided to both adults and children. °Patients are selected via a lottery and there is often a waiting list. °  °[facility] [address], °[place] ° (336) 763-8833 [email or web address] °  °[facility] [address], [place], [place] (336)723-1848, Ext. 123 Second and Fourth Thursday of each month, opens at 6:30 AM; Clinic ends at 9 AM.  Patients are seen on a first-come first-served basis, and a limited number are seen during each clinic.  ° °[facility] ° [address], [place], [place] (336) 723-7904    Eligibility Requirements °You must have lived in [place], [place], or [place] counties for at least the last three months. °  You cannot be eligible for state or federal sponsored healthcare insurance, including Veterans Administration, Medicaid, or Medicare. °  You generally cannot be eligible for healthcare insurance through your employer.  °  How to apply: °Eligibility screenings are held every Tuesday and Wednesday afternoon from 1:00 pm until 4:00 pm. You do not need an appointment for the interview!  °[facility] [address], [place], [place] 336-631-2330   °[facility]  336-342-8273   °[place]  336-703-3100   °[facility]  336-570-6415   ° °Behavioral Health Resources in the Community: °Intensive Outpatient Programs °Organization         Address  Phone  Notes  °[facility] [address], [place], [place] 336-878-6098   °[facility] [address], [place], [place] 336-832-9800   °[facility] [address], [place], [place] ° 336-882-2125   °[facility] [address],  °[place], [place] 1-800-853-5163 or 336-641-4981   °Substance Abuse Resources °Organization         Address  Phone  Notes  °[facility]  336-882-2125   °[facility]  336-784-9470   °[facility]  336-285-9073   °[facility]  336-845-3988   °Residential & Outpatient Substance Abuse Program  1-800-659-3381   °Psychological Services °Organization         Address  Phone  Notes  °[facility]  336- 832-9600   °[facility]  336- 378-7881   °[facility] [address], [place] 1-800-853-5163 or 336-641-4981   ° °Mobile Crisis Teams °Organization         Address  Phone  Notes  °[facility], Mobile Crisis Care Unit  1-877-626-1772   °Assertive °[facility] ° [address]  [place], [place] 336-834-9664   °Sharon DeEsch [address] °[place] 336-554-5454   ° °Self-Help/Support Groups °Organization         Address  Phone             Notes  °[facility]  - variety of support groups  336- 373-1402 Call for more information  °Narcotics Anonymous (NA), [facility] [address], °[place]  2 meetings at this location  ° °  Residential Treatment Programs Organization         Address  Phone  Notes  [facility] [address],    [place]  [phone]   [facility]  [address], [place] [phone], [place], [place] [phone]   [facility] [address], [place] [phone] Admissions: 8am-3pm M-F  [facility] [address]    [place], [place] [phone]   [facility] [address], [place], [place] [phone]   [facility] [address]  [place], [place] [phone]) [phone]   [facility] Intensive Outpatient [address], [place], [place] [phone]) [phone]   [facility] [address]  [place], [place] [phone]) [phone] or [phone]   [facility] ([facility] [address] [place], [place] [phone] Accepts Medicaid  [facility] [address]  [place] [phone] Substance Abuse/Addiction Treatment   [facility] Organization         Address  Phone  Notes  [facility]  (908) 449-4026(888) (989)125-9404   Angie FavaJulie Brannon, PhD [address], [address] [place], [place]   ([phone](336) [phone] or (954) [phone](336) [phone]   [facility]   [address] [place], [place] [phone](336) [phone]   [facility] [address], [place], [place] [phone](336) [phone] Insurance/Medicaid/sponsorship through [facility]  [facility] [address]                                    [place], [place] [phone](336) [phone] Therapy/tele-psych/case    [facility] [address]   [place], [place] [phone](336) [phone]    Dr. Lolly MustacheArfeen  [phone](336) [phone]   [facility]  [employer] [facility] 1) [address], [place] 2) [address], [place] 3)  [address], [place] [phone](336) [phone](336) [phone]  [phone](336) [phone]   [facility] Child Abuse Hotline 9470041234(336) 618-057-8865 or [phone](336) 5157678363 (After Hours)      Take the prescriptions as directed. Keep the splint and dressing in place until seen in follow up tomorrow morning. Keep the dressing clean and dry. Go to the Hand Surgeon's office tomorrow morning at 1030am to be seen in follow up. They are expecting you. Return to the Emergency Department immediately sooner if worsening.

## 2015-03-12 ENCOUNTER — Encounter (HOSPITAL_COMMUNITY): Payer: Self-pay | Admitting: Emergency Medicine

## 2015-03-12 ENCOUNTER — Emergency Department (HOSPITAL_COMMUNITY): Payer: Medicare Other

## 2015-03-12 ENCOUNTER — Emergency Department (HOSPITAL_COMMUNITY)
Admission: EM | Admit: 2015-03-12 | Discharge: 2015-03-12 | Disposition: A | Discharge status: Home / Self Care | Payer: Medicare Other | Attending: Emergency Medicine | Admitting: Emergency Medicine

## 2015-03-12 DIAGNOSIS — Y9301 Activity, walking, marching and hiking: Secondary | ICD-10-CM | POA: Insufficient documentation

## 2015-03-12 DIAGNOSIS — Y9289 Other specified places as the place of occurrence of the external cause: Secondary | ICD-10-CM | POA: Insufficient documentation

## 2015-03-12 DIAGNOSIS — S8991XA Unspecified injury of right lower leg, initial encounter: Secondary | ICD-10-CM | POA: Diagnosis present

## 2015-03-12 DIAGNOSIS — M1711 Unilateral primary osteoarthritis, right knee: Secondary | ICD-10-CM | POA: Diagnosis not present

## 2015-03-12 DIAGNOSIS — Z8719 Personal history of other diseases of the digestive system: Secondary | ICD-10-CM | POA: Diagnosis not present

## 2015-03-12 DIAGNOSIS — S8992XA Unspecified injury of left lower leg, initial encounter: Secondary | ICD-10-CM | POA: Insufficient documentation

## 2015-03-12 DIAGNOSIS — G8929 Other chronic pain: Secondary | ICD-10-CM | POA: Insufficient documentation

## 2015-03-12 DIAGNOSIS — S79912A Unspecified injury of left hip, initial encounter: Secondary | ICD-10-CM | POA: Insufficient documentation

## 2015-03-12 DIAGNOSIS — Z9889 Other specified postprocedural states: Secondary | ICD-10-CM | POA: Insufficient documentation

## 2015-03-12 DIAGNOSIS — Z872 Personal history of diseases of the skin and subcutaneous tissue: Secondary | ICD-10-CM | POA: Insufficient documentation

## 2015-03-12 DIAGNOSIS — F17210 Nicotine dependence, cigarettes, uncomplicated: Secondary | ICD-10-CM | POA: Insufficient documentation

## 2015-03-12 DIAGNOSIS — Y998 Other external cause status: Secondary | ICD-10-CM | POA: Insufficient documentation

## 2015-03-12 DIAGNOSIS — W1839XA Other fall on same level, initial encounter: Secondary | ICD-10-CM | POA: Insufficient documentation

## 2015-03-12 DIAGNOSIS — Z792 Long term (current) use of antibiotics: Secondary | ICD-10-CM | POA: Diagnosis not present

## 2015-03-12 DIAGNOSIS — M25552 Pain in left hip: Secondary | ICD-10-CM

## 2015-03-12 MED ORDER — OXYCODONE-ACETAMINOPHEN 5-325 MG PO TABS: 1.0000 | ORAL_TABLET | ORAL | Status: AC | PRN

## 2015-03-12 NOTE — ED Notes (Signed)
Pt awaiting case management for walker delivery before discharge.

## 2015-03-12 NOTE — Care Management (Signed)
CM consult received from EDP regarding need for [facility] nursing and PT. Pt came to ED after fall at home. Pt says he uses wheelchair in the home and cane when he leaves. Pt says he has a standard walker, CM believes he would benefit from rollator given his multiple muskuloskeletal problems. Pt has chosen [facility] for [facility] and DME needs. Alroy Bailiff, of [facility], made aware of referral and will obtain pt info from chart. [facility] will provide rollator if pt's RW was not purchased in the past 5 years. Pt aware HH has 48 hours to make first visit. Pt reports not seeing PCP in many years as he has been to specialist. Pt's PCP was Dr. Janna Arch. Discussed with Dr. Janna Arch and he is agreeable to sign HH orders, attempted to make f/u appointment but was unable to arrange through office due to phone problems. Will encourage pt to call and make f/u appointment after DC from ED, have made [facility] agency aware pt should have f/u scheduled with Dr. Janna Arch.

## 2015-03-12 NOTE — Discharge Instructions (Signed)
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
## 2015-03-12 NOTE — ED Notes (Signed)
Per EMS patient fell yesterday afternoon. Patient states he was walking with a stick, but usually uses a wheelchair. States that he has been having left hip pain, and left hip gave out and he fell on his right knee. States chronic pain bilateral knees, but unable to bear weight on right knee.

## 2015-03-18 ENCOUNTER — Emergency Department (HOSPITAL_COMMUNITY): Payer: Medicare Other

## 2015-03-18 ENCOUNTER — Encounter (HOSPITAL_COMMUNITY): Payer: Self-pay | Admitting: Cardiology

## 2015-03-18 ENCOUNTER — Emergency Department (HOSPITAL_COMMUNITY)
Admission: EM | Admit: 2015-03-18 | Discharge: 2015-03-18 | Disposition: A | Discharge status: Home / Self Care | Payer: Medicare Other | Attending: Emergency Medicine | Admitting: Emergency Medicine

## 2015-03-18 DIAGNOSIS — R350 Frequency of micturition: Secondary | ICD-10-CM | POA: Diagnosis not present

## 2015-03-18 DIAGNOSIS — Z9889 Other specified postprocedural states: Secondary | ICD-10-CM | POA: Insufficient documentation

## 2015-03-18 DIAGNOSIS — M545 Low back pain: Secondary | ICD-10-CM | POA: Diagnosis not present

## 2015-03-18 DIAGNOSIS — F17210 Nicotine dependence, cigarettes, uncomplicated: Secondary | ICD-10-CM | POA: Insufficient documentation

## 2015-03-18 DIAGNOSIS — M549 Dorsalgia, unspecified: Secondary | ICD-10-CM

## 2015-03-18 DIAGNOSIS — R2689 Other abnormalities of gait and mobility: Secondary | ICD-10-CM | POA: Insufficient documentation

## 2015-03-18 DIAGNOSIS — Z8719 Personal history of other diseases of the digestive system: Secondary | ICD-10-CM | POA: Diagnosis not present

## 2015-03-18 DIAGNOSIS — G8929 Other chronic pain: Secondary | ICD-10-CM | POA: Insufficient documentation

## 2015-03-18 DIAGNOSIS — Z872 Personal history of diseases of the skin and subcutaneous tissue: Secondary | ICD-10-CM | POA: Insufficient documentation

## 2015-03-18 LAB — URINALYSIS, ROUTINE W REFLEX MICROSCOPIC
Bilirubin Urine: NEGATIVE
Glucose, UA: NEGATIVE mg/dL
KETONES UR: NEGATIVE mg/dL
Leukocytes, UA: NEGATIVE
Nitrite: NEGATIVE
PROTEIN: NEGATIVE mg/dL
Specific Gravity, Urine: 1.02 (ref 1.005–1.030)
pH: 5.5 (ref 5.0–8.0)

## 2015-03-18 LAB — URINE MICROSCOPIC-ADD ON: SQUAMOUS EPITHELIAL / LPF: NONE SEEN

## 2015-03-18 MED ORDER — HYDROCODONE-ACETAMINOPHEN 5-325 MG PO TABS
2.0000 | ORAL_TABLET | Freq: Once | ORAL | Status: AC
  Administered 2015-03-18: 2 via ORAL
  Filled 2015-03-18: qty 2

## 2015-03-18 MED ORDER — HYDROCODONE-ACETAMINOPHEN 5-325 MG PO TABS: 1.0000 | ORAL_TABLET | ORAL | Status: AC | PRN
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
## 2015-03-18 NOTE — Discharge Instructions (Signed)
Chronic Back Pain Take Tylenol for mild pain or the pain medicine prescribed for bad pain. The pain medicine prescribed can cause constipation. Takes Senokot so that you won't become constipated.Don't take Tylenol together with the pain medicine prescribed as the combination to be dangerous to your. Keep your appointment with the orthopedic specialist tomorrow. Call Dr.Dondiego tomorrow to arrange to be seen . He can become your primary care physician.  When back pain lasts longer than 3 months, it is called chronic back pain.People with chronic back pain often go through certain periods that are more intense (flare-ups).  CAUSES Chronic back pain can be caused by wear and tear (degeneration) on different structures in your back. These structures include:  The bones of your spine (vertebrae) and the joints surrounding your spinal cord and nerve roots (facets).  The strong, fibrous tissues that connect your vertebrae (ligaments). Degeneration of these structures may result in pressure on your nerves. This can lead to constant pain. HOME CARE INSTRUCTIONS  Avoid bending, heavy lifting, prolonged sitting, and activities which make the problem worse.  Take brief periods of rest throughout the day to reduce your pain. Lying down or standing usually is better than sitting while you are resting.  Take over-the-counter or prescription medicines only as directed by your caregiver. SEEK IMMEDIATE MEDICAL CARE IF:   You have weakness or numbness in one of your legs or feet.  You have trouble controlling your bladder or bowels.  You have nausea, vomiting, abdominal pain, shortness of breath, or fainting.   This information is not intended to replace advice given to you by your health care provider. Make sure you discuss any questions you have with your health care provider.   Document Released: 03/05/2004 Document Revised: 04/20/2011 Document Reviewed: 07/16/2014 Elsevier Interactive Patient  Education [employer]

## 2015-03-18 NOTE — ED Notes (Signed)
Ambulated patient with walker. Patient ambulated on his own with use of walker. Dr at bedside.

## 2015-03-18 NOTE — ED Notes (Signed)
Lower back pain for a while.  Pain is worse.

## 2015-08-27 IMAGING — CR DG FOOT COMPLETE 3+V*L*
3 series · 3 of 3 positions shown · non-contrast
Comparison: None.

CLINICAL DATA: Foot injury with laceration

EXAM:
LEFT FOOT - COMPLETE 3+ VIEW

[x foot ap left]
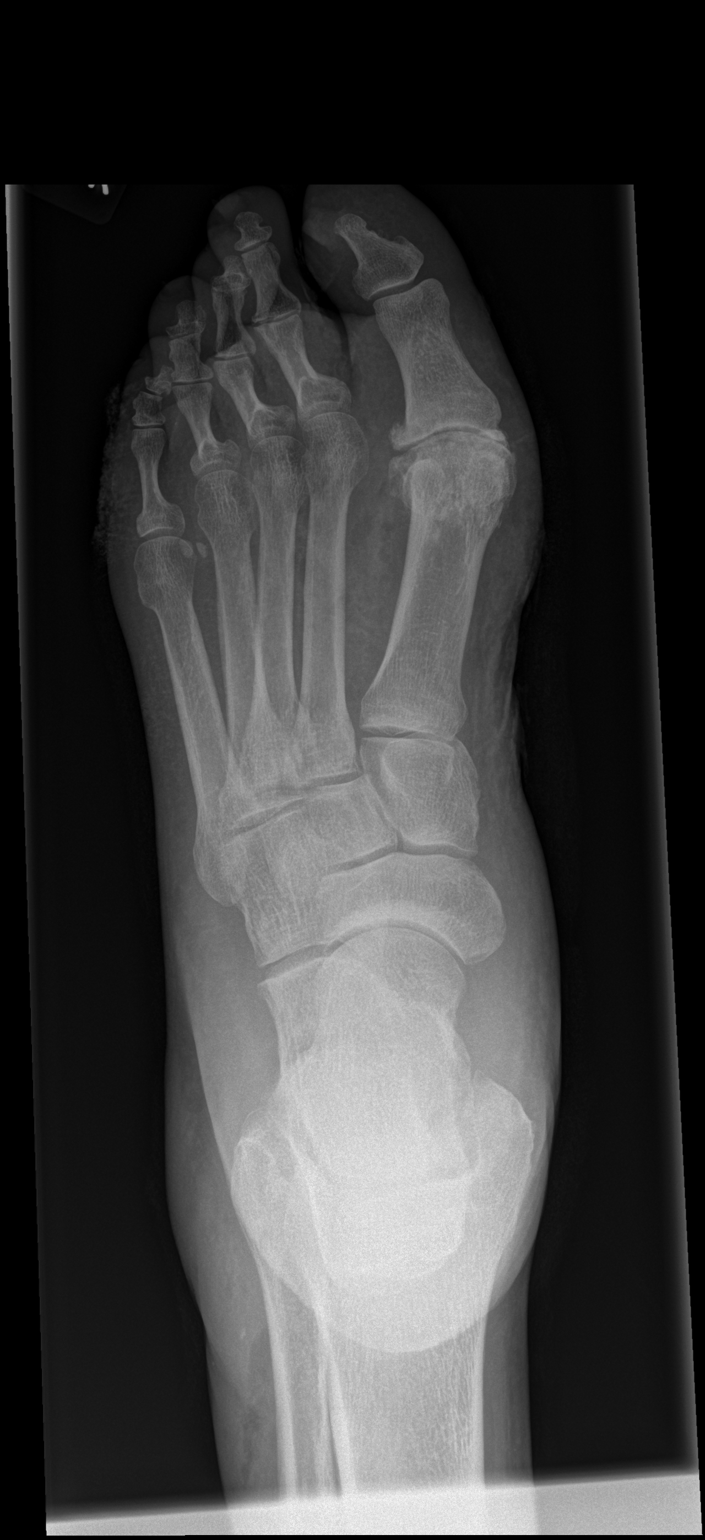

[x foot obl left]
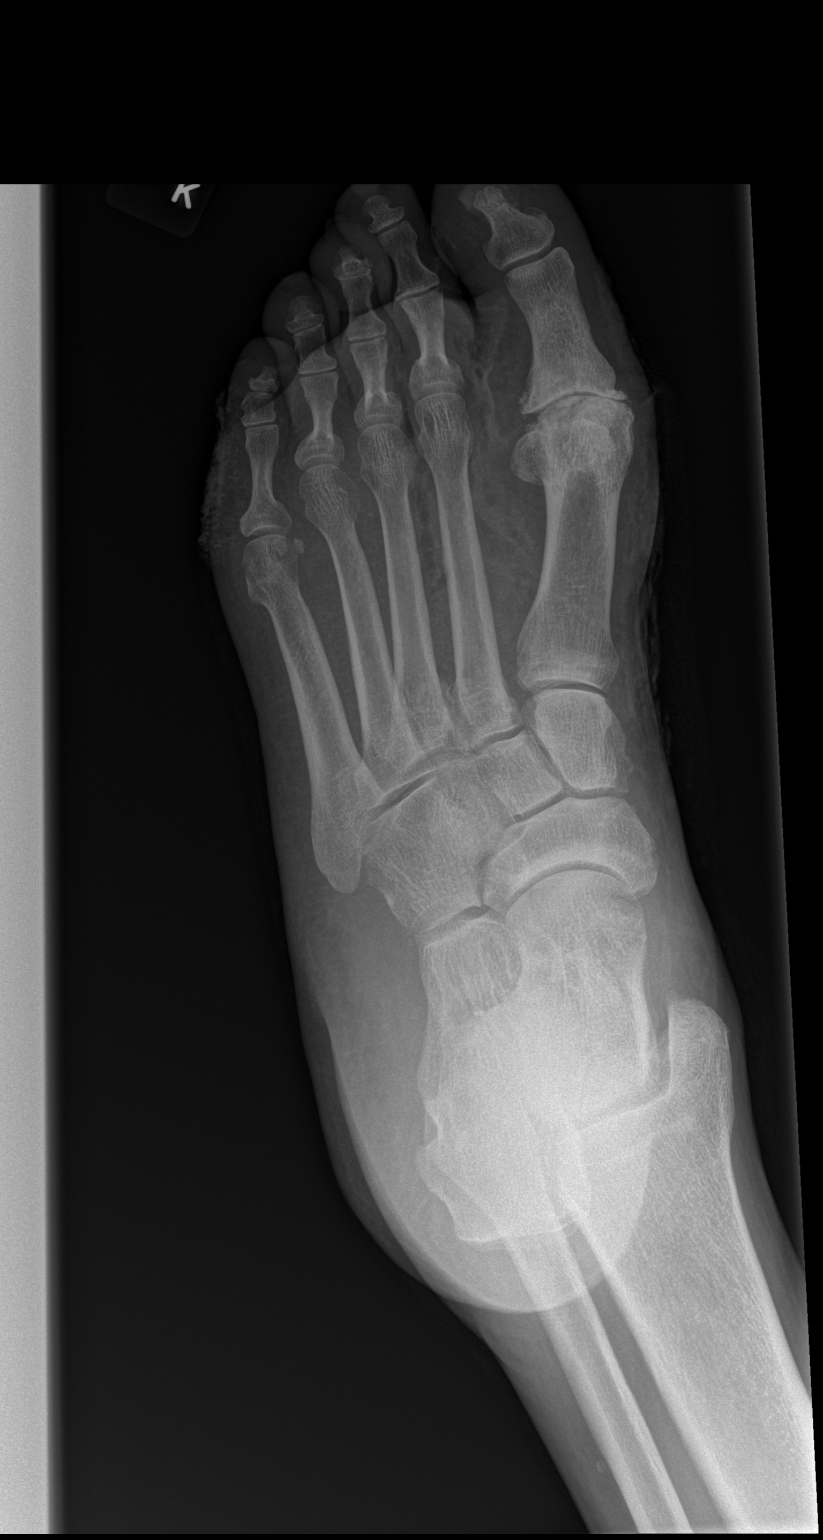

[x foot lat left]
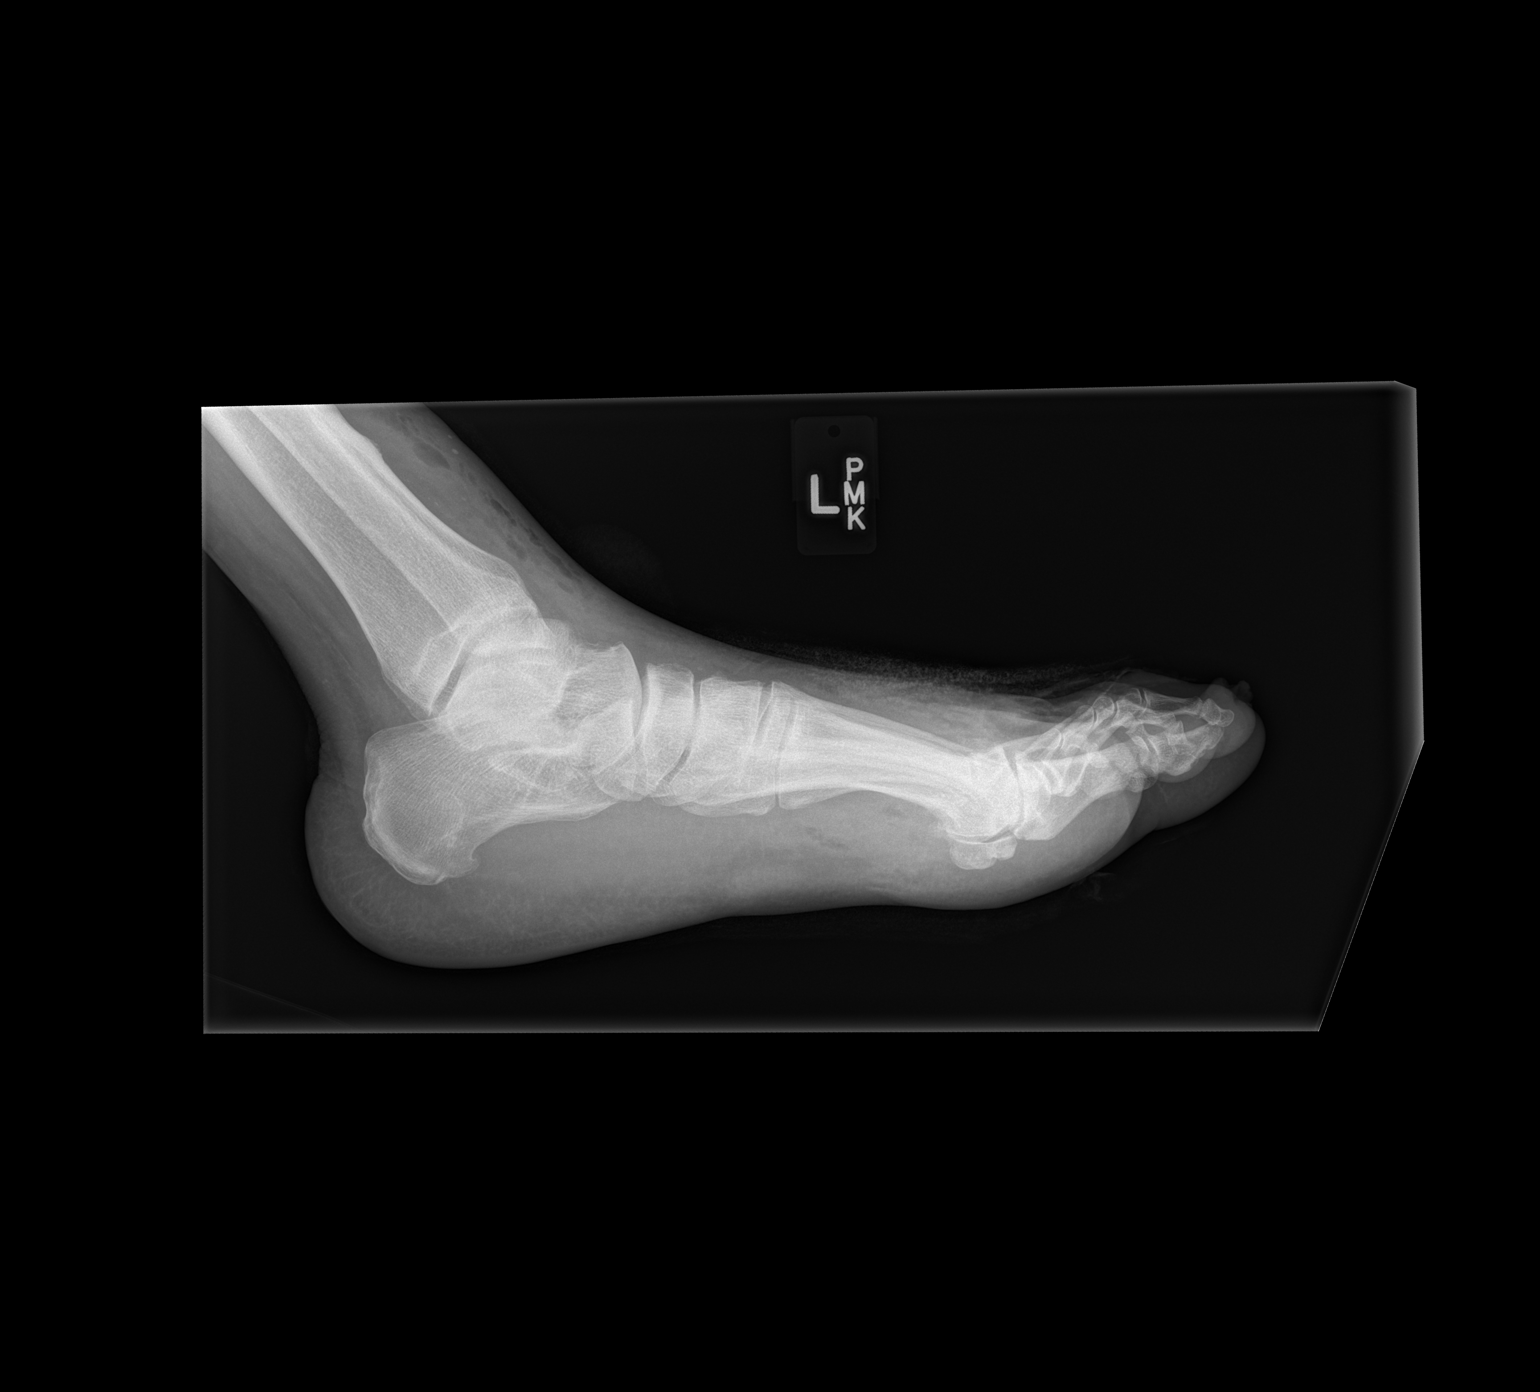

[3 of 3 positions shown; findings below may reference images not displayed]

FINDINGS: Soft tissue injury with gas in the soft tissues dorsal to the ankle
and foot. Soft tissue injury noted.

Negative for fracture or foreign body. Advanced degenerative change
in the first MTP joint.
IMPRESSION: Soft tissue injury with laceration and gas in the tissues. No acute
bony abnormality
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
## 2016-05-17 ENCOUNTER — Encounter (HOSPITAL_COMMUNITY): Payer: Self-pay | Admitting: *Deleted

## 2016-05-17 ENCOUNTER — Emergency Department (HOSPITAL_COMMUNITY)
Admission: EM | Admit: 2016-05-17 | Discharge: 2016-05-17 | Disposition: A | Discharge status: Home / Self Care | Payer: Medicare Other | Attending: Emergency Medicine | Admitting: Emergency Medicine

## 2016-05-17 DIAGNOSIS — Z79899 Other long term (current) drug therapy: Secondary | ICD-10-CM | POA: Insufficient documentation

## 2016-05-17 DIAGNOSIS — F17210 Nicotine dependence, cigarettes, uncomplicated: Secondary | ICD-10-CM | POA: Diagnosis not present

## 2016-05-17 DIAGNOSIS — T7840XA Allergy, unspecified, initial encounter: Secondary | ICD-10-CM | POA: Diagnosis present

## 2016-05-17 MED ORDER — FAMOTIDINE 20 MG PO TABS: 20.0000 mg | ORAL_TABLET | Freq: Two times a day (BID) | ORAL | 0 refills | Status: AC

## 2016-05-17 MED ORDER — DIPHENHYDRAMINE HCL 50 MG/ML IJ SOLN
25.0000 mg | Freq: Once | INTRAMUSCULAR | Status: AC
  Administered 2016-05-17: 25 mg via INTRAVENOUS
  Filled 2016-05-17: qty 1

## 2016-05-17 MED ORDER — DIPHENHYDRAMINE HCL 25 MG PO CAPS
25.0000 mg | ORAL_CAPSULE | Freq: Once | ORAL | Status: AC
  Administered 2016-05-17: 25 mg via ORAL

## 2016-05-17 MED ORDER — PREDNISONE 20 MG PO TABS: ORAL_TABLET | ORAL | 0 refills | Status: AC

## 2016-05-17 MED ORDER — DIPHENHYDRAMINE HCL 25 MG PO CAPS
ORAL_CAPSULE | ORAL | Status: AC
  Filled 2016-05-17: qty 1

## 2016-05-17 MED ORDER — METHYLPREDNISOLONE SODIUM SUCC 125 MG IJ SOLR
125.0000 mg | Freq: Once | INTRAMUSCULAR | Status: AC
  Administered 2016-05-17: 125 mg via INTRAVENOUS
  Filled 2016-05-17: qty 2

## 2016-05-17 MED ORDER — HYDROXYZINE HCL 25 MG PO TABS: 25.0000 mg | ORAL_TABLET | Freq: Four times a day (QID) | ORAL | 0 refills | Status: AC | PRN

## 2016-05-17 MED ORDER — HYDROXYZINE HCL 25 MG PO TABS
25.0000 mg | ORAL_TABLET | Freq: Once | ORAL | Status: AC
  Administered 2016-05-17: 25 mg via ORAL
  Filled 2016-05-17: qty 1

## 2016-05-17 MED ORDER — FAMOTIDINE IN NACL 20-0.9 MG/50ML-% IV SOLN
20.0000 mg | Freq: Once | INTRAVENOUS | Status: AC
  Administered 2016-05-17: 20 mg via INTRAVENOUS
  Filled 2016-05-17: qty 50

## 2016-05-17 NOTE — ED Notes (Signed)
Pt provided with Benadryl to take once he gets home since he will have to drive self per Dr Estell Harpin verbal order.  Pt states understanding of care given and follow up instructions though pt does not want to be discharged.  Pt states he is still having trouble breathing.  Pt was sleeping upon entering room, O2 Sat of 97% on room air, Dr Estell Harpin brought in to re-evaluate pts throat with no signs of swelling.  After Discharge pt requested to lay in bed for a while because "it's cold outside and that will just make me worse".  Informed pt that we would need his room for other patients and escorted him to the lobby in a wheel chair

## 2016-05-17 NOTE — ED Notes (Signed)
Patient states that his itching has returned.  Scratching his head and legs.  States that he does not want to be discharged in this shape, that he will have to come back.
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
## 2016-05-17 NOTE — Discharge Instructions (Signed)
Follow up with your md if not improving. °

## 2016-05-17 NOTE — ED Triage Notes (Signed)
Pt reports itching all over that started about 2 hours ago. Pt has welts on most of his body. Pt states he took 2-25 mg benadryl at home with without relief.

## 2016-05-17 NOTE — ED Notes (Signed)
ED Provider at bedside. 

## 2019-06-11 ENCOUNTER — Encounter (HOSPITAL_COMMUNITY): Payer: Self-pay | Admitting: Emergency Medicine

## 2019-06-11 ENCOUNTER — Emergency Department (HOSPITAL_COMMUNITY)
Admission: EM | Admit: 2019-06-11 | Discharge: 2019-07-11 | Disposition: E | Payer: Medicare HMO | Attending: Emergency Medicine | Admitting: Emergency Medicine

## 2019-06-11 ENCOUNTER — Other Ambulatory Visit: Payer: Self-pay

## 2019-06-11 DIAGNOSIS — W28XXXA Contact with powered lawn mower, initial encounter: Secondary | ICD-10-CM | POA: Insufficient documentation

## 2019-06-11 DIAGNOSIS — F17210 Nicotine dependence, cigarettes, uncomplicated: Secondary | ICD-10-CM | POA: Insufficient documentation

## 2019-06-11 DIAGNOSIS — Y92008 Other place in unspecified non-institutional (private) residence as the place of occurrence of the external cause: Secondary | ICD-10-CM | POA: Diagnosis not present

## 2019-06-11 DIAGNOSIS — X010XXA Exposure to flames in uncontrolled fire, not in building or structure, initial encounter: Secondary | ICD-10-CM | POA: Diagnosis not present

## 2019-06-11 DIAGNOSIS — Z79899 Other long term (current) drug therapy: Secondary | ICD-10-CM | POA: Diagnosis not present

## 2019-06-11 DIAGNOSIS — T24201A Burn of second degree of unspecified site of right lower limb, except ankle and foot, initial encounter: Secondary | ICD-10-CM

## 2019-06-11 DIAGNOSIS — Y9389 Activity, other specified: Secondary | ICD-10-CM | POA: Diagnosis not present

## 2019-06-11 DIAGNOSIS — X011XXA Exposure to smoke in uncontrolled fire, not in building or structure, initial encounter: Secondary | ICD-10-CM | POA: Diagnosis not present

## 2019-06-11 DIAGNOSIS — Y999 Unspecified external cause status: Secondary | ICD-10-CM | POA: Insufficient documentation

## 2019-06-11 DIAGNOSIS — I469 Cardiac arrest, cause unspecified: Secondary | ICD-10-CM | POA: Diagnosis not present

## 2019-06-11 DIAGNOSIS — T310 Burns involving less than 10% of body surface: Secondary | ICD-10-CM | POA: Diagnosis not present

## 2019-06-11 DIAGNOSIS — T24331A Burn of third degree of right lower leg, initial encounter: Secondary | ICD-10-CM | POA: Diagnosis present

## 2019-06-11 LAB — CBG MONITORING, ED: Glucose-Capillary: 110 mg/dL — ABNORMAL HIGH (ref 70–99)

## 2019-06-11 MED ORDER — CALCIUM CHLORIDE 10 % IV SOLN
INTRAVENOUS | Status: DC | PRN
  Administered 2019-06-11: 1 g via INTRAVENOUS

## 2019-06-11 MED ORDER — EPINEPHRINE 1 MG/10ML IJ SOSY
PREFILLED_SYRINGE | INTRAMUSCULAR | Status: AC
  Filled 2019-06-11: qty 10

## 2019-06-11 MED ORDER — EPINEPHRINE 1 MG/10ML IJ SOSY: PREFILLED_SYRINGE | INTRAMUSCULAR | Status: AC | PRN

## 2019-06-11 MED ORDER — EPINEPHRINE 1 MG/10ML IJ SOSY
PREFILLED_SYRINGE | INTRAMUSCULAR | Status: DC | PRN
  Administered 2019-06-11 (×8): 1 mg via INTRAVENOUS

## 2019-06-11 MED ORDER — SODIUM BICARBONATE 8.4 % IV SOLN
INTRAVENOUS | Status: DC | PRN
  Administered 2019-06-11: 50 meq via INTRAVENOUS

## 2019-06-12 MED FILL — Medication: Qty: 1 | Status: AC
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
## 2019-07-11 NOTE — ED Notes (Signed)
Family request more time before viewing patient.
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
## 2019-07-11 DEATH — deceased
